# Patient Record
Sex: Male | Born: 1965 | Race: White | Hispanic: No | Smoking: Current every day smoker
Health system: Southern US, Community
[De-identification: ages and names within clinical notes are randomized; demographics above are authoritative.]

## PROBLEM LIST (undated history)

## (undated) DIAGNOSIS — F32A Depression, unspecified: Secondary | ICD-10-CM

## (undated) DIAGNOSIS — F329 Major depressive disorder, single episode, unspecified: Secondary | ICD-10-CM

## (undated) DIAGNOSIS — G4730 Sleep apnea, unspecified: Secondary | ICD-10-CM

## (undated) DIAGNOSIS — K4090 Unilateral inguinal hernia, without obstruction or gangrene, not specified as recurrent: Secondary | ICD-10-CM

## (undated) DIAGNOSIS — A159 Respiratory tuberculosis unspecified: Secondary | ICD-10-CM

## (undated) HISTORY — PX: LUMBAR DISC SURGERY: SHX700

## (undated) HISTORY — PX: BACK SURGERY: SHX140

---

## 1997-08-31 ENCOUNTER — Emergency Department (HOSPITAL_COMMUNITY): Admission: EM | Admit: 1997-08-31 | Discharge: 1997-08-31 | Payer: Self-pay | Admitting: Emergency Medicine

## 1998-06-17 ENCOUNTER — Encounter: Payer: Self-pay | Admitting: Emergency Medicine

## 1998-06-17 ENCOUNTER — Emergency Department (HOSPITAL_COMMUNITY): Admission: EM | Admit: 1998-06-17 | Discharge: 1998-06-17 | Payer: Self-pay | Admitting: Emergency Medicine

## 2002-04-01 ENCOUNTER — Emergency Department (HOSPITAL_COMMUNITY): Admission: EM | Admit: 2002-04-01 | Discharge: 2002-04-01 | Payer: Self-pay | Admitting: Emergency Medicine

## 2002-05-20 ENCOUNTER — Encounter: Payer: Self-pay | Admitting: Chiropractic Medicine

## 2002-05-20 ENCOUNTER — Ambulatory Visit (HOSPITAL_COMMUNITY): Admission: RE | Admit: 2002-05-20 | Discharge: 2002-05-20 | Payer: Self-pay | Admitting: Chiropractic Medicine

## 2002-05-24 ENCOUNTER — Emergency Department (HOSPITAL_COMMUNITY): Admission: EM | Admit: 2002-05-24 | Discharge: 2002-05-24 | Payer: Self-pay | Admitting: Emergency Medicine

## 2002-06-09 ENCOUNTER — Ambulatory Visit (HOSPITAL_COMMUNITY): Admission: RE | Admit: 2002-06-09 | Discharge: 2002-06-09 | Payer: Self-pay | Admitting: Neurosurgery

## 2002-06-09 ENCOUNTER — Encounter: Payer: Self-pay | Admitting: Neurosurgery

## 2003-02-01 ENCOUNTER — Emergency Department (HOSPITAL_COMMUNITY): Admission: EM | Admit: 2003-02-01 | Discharge: 2003-02-01 | Payer: Self-pay | Admitting: Emergency Medicine

## 2003-02-01 ENCOUNTER — Encounter: Payer: Self-pay | Admitting: Emergency Medicine

## 2003-06-07 ENCOUNTER — Emergency Department (HOSPITAL_COMMUNITY): Admission: EM | Admit: 2003-06-07 | Discharge: 2003-06-07 | Payer: Self-pay | Admitting: Emergency Medicine

## 2007-07-27 ENCOUNTER — Emergency Department (HOSPITAL_COMMUNITY): Admission: EM | Admit: 2007-07-27 | Discharge: 2007-07-27 | Payer: Self-pay | Admitting: Emergency Medicine

## 2007-07-27 HISTORY — PX: ABSCESS DRAINAGE: SHX1119

## 2010-09-17 NOTE — Consult Note (Signed)
NAMEBOYKIN, BAETZ NO.:  [id number]   MEDICAL RECORD NO.:  [id number]          PATIENT TYPE:  EMS   LOCATION:  MAJO                         FACILITY:  [facility]   PHYSICIAN:  Excell Seltzer. Annabell Howells, M.D.    DATE OF BIRTH:  09-Mar-1966   DATE OF CONSULTATION:  DATE OF DISCHARGE:                                 CONSULTATION   CHIEF COMPLAINT:  Scrotal pain.   HISTORY:  Glen Friedman is a 45 year old, white male who has a 4-day history of  scrotal pain and swelling.  He noted a boil in the scrotum which he  lanced with a needle.  He then developed several satellite lesions with  associated cellulitis.  It was felt that urologic consultation was  indicated.  The patient denies fever but has had some myalgias  particularly in his back and lower extremities.  He denies voiding  complaints or prior urologic history.   PAST HISTORY:  Pertinent for no drug allergies.   CURRENT MEDICATIONS:  None.   MEDICAL HISTORY:  Otherwise unremarkable.   SURGICAL HISTORY:  Otherwise unremarkable.   FAMILY HISTORY:  Pertinent for multiple cancers.   SOCIAL HISTORY:  He is a smoker.  He drinks occasionally.  He denies  drug abuse.  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   REVIEW OF SYSTEMS:  As noted, he denies fever.  He denies chest pain.  He denies shortness of breath.  He has no abdominal or bowel complaints.  He is voiding without difficulties.  He does have the myalgias in the  low back and extremities.  He denies any lower extremity edema.  He  denies headache.  He has no neurologic complaints.  He is otherwise  entirely without complaints.   PHYSICAL EXAMINATION:  VITAL SIGNS:  Blood pressure is 143/86,  temperature 98.8, pulse 86, respirations 20, pulse ox 97 on room air.  GENERAL:  He is well-developed, well-nourished, white male in no acute  stress.  Alert and oriented x3.  HEAD/FACE:  Normocephalic atraumatic.  NECK:  Supple.  LUNGS:  Clear.  Normal effort.  HEART:  Regular rate and rhythm.  ABDOMEN:  Soft, moderately obese, and nontender without mass,  hepatosplenomegaly, or CVA tenderness.  No hernias or inguinal  adenopathy are noted.  GU:  Unremarkable phallus.  Scrotum is slightly erythematous in the  dependent portion, and on the very posterior scrotum where it adjoins  the perineum, there is induration and tenderness with erythema.  There  is what appears to be a previously lanced boil in the midline with  induration and erythema with discrete inflammatory foci both to the left  and to the right.  Additionally, there are 2, small, satellite boils on  the more anterior scrotum on the left and right.  The testicles are  unremarkable.  There is no tenderness into the inner thighs.  RECTAL:  Not performed.  EXTREMITIES:  Full range of motion without edema.  NEUROLOGIC:  Grossly intact.  SKIN:  Warm and dry.   LAB WORK:  His white count is 13.3, hemoglobin 13.4.  There is a mild  left shift.  BMP is unremarkable with creatinine of 0.86.   Urinalysis is clear.   He had a CT pelvis which revealed mild inflammation in the inguinal and  posterior gluteal regions without evidence of abscess or abnormal gas  collection.  He had mildly enlarged reactive bilateral pelvic and  inguinal lymph nodes.   IMPRESSION:  Multiple scrotal abscesses with cellulitis.  I do not see  clinical evidence of Fournier's gangrene.  He is not a diabetic.   PLAN:  At this point, I believe he needs intravenous antibiotics in the  ER, incision and drainage of the lesions, and can be discharged home on  oral antibiotics.      Excell Seltzer. Annabell Howells, M.D.  Electronically Signed     JJW/MEDQ  D:  07/27/2007  T:  07/27/2007  Job:  [id number]   cc:   Emergency Room

## 2010-09-17 NOTE — Op Note (Signed)
Glen Friedman, Glen Friedman NO.:  [id number]   MEDICAL RECORD NO.:  [id number]          PATIENT TYPE:  EMS   LOCATION:  MAJO                         FACILITY:  [facility]   PHYSICIAN:  Excell Seltzer. Annabell Howells, M.D.    DATE OF BIRTH:  06/09/1965   DATE OF PROCEDURE:  07/27/2007  DATE OF DISCHARGE:                               OPERATIVE REPORT   PROCEDURE:  I&D of multiple scrotal abscesses.   PREOPERATIVE DIAGNOSIS:  Multiple scrotal abscesses.   POSTOPERATIVE DIAGNOSIS:  Multiple scrotal abscesses.   SURGEON:  Dr. Bjorn Pippin   ANESTHESIA:  Local.   SPECIMEN:  Wound culture.   COMPLICATIONS:  None.   INDICATIONS:  [name] is a 45 year old white male with multiple scrotal  abscess who is to undergo incision and drainage.  He has a mild  elevation of his white count but no fever, a CT that revealed some  inflammatory changes in the gluteal and inguinal region but no clinical  evidence of Fournier's-type gangrene.   FINDINGS AND PROCEDURE:  The patient was given informed consent.  His  genitalia was prepped with Betadine solution; he was draped with sterile  towels.  The various abscess sites were infiltrated with 1% lidocaine  without epinephrine.  A total of approximately 8 mL was used.  A knife  was then used to incise each of the abscesses.  The 2 most superior were  quite small but did contain some purulent material.  The 3 in the more  dependent scrotum were larger.  These were incised, probed with a  hemostat.  Only the 1 on the right produced any significant purulent  drainage.  This was cultured.  Once the abscess cavities had been probed  sufficiently, the wounds were packed with quarter-inch Iodoform gauze; a  4x4 was placed, and he was given athletic supporter to maintain  coverage.  There were no complications during the procedure.      Excell Seltzer. Annabell Howells, M.D.  Electronically Signed     JJW/MEDQ  D:  07/27/2007  T:  07/27/2007  Job:  [id number]
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
## 2011-01-27 LAB — URINALYSIS, ROUTINE W REFLEX MICROSCOPIC
Bilirubin Urine: NEGATIVE
Glucose, UA: NEGATIVE
Hgb urine dipstick: NEGATIVE
Ketones, ur: 15 — AB
Nitrite: NEGATIVE
Protein, ur: NEGATIVE
Specific Gravity, Urine: 1.031 — ABNORMAL HIGH
Urobilinogen, UA: 0.2
pH: 5.5

## 2011-01-27 LAB — DIFFERENTIAL
Basophils Absolute: 0
Basophils Relative: 0
Eosinophils Absolute: 0.3
Eosinophils Relative: 2
Lymphocytes Relative: 29
Lymphs Abs: 3.8
Monocytes Absolute: 0.9
Monocytes Relative: 7
Neutro Abs: 8.2 — ABNORMAL HIGH
Neutrophils Relative %: 62

## 2011-01-27 LAB — BASIC METABOLIC PANEL
Calcium: 9.1
Creatinine, Ser: 0.86
GFR calc Af Amer: >60
GFR calc non Af Amer: >60
Glucose, Bld: 96
Potassium: 3.6

## 2011-01-27 LAB — CULTURE, ROUTINE-ABSCESS

## 2011-01-27 LAB — URINE CULTURE
Colony Count: NO GROWTH
Culture: NO GROWTH

## 2011-01-27 LAB — CBC
HCT: 38.9 — ABNORMAL LOW
Hemoglobin: 13.4
MCHC: 34.5
MCV: 93.3
Platelets: 299
RBC: 4.17 — ABNORMAL LOW
RDW: 13.3
WBC: 13.3 — ABNORMAL HIGH

## 2011-01-27 LAB — BASIC METABOLIC PANEL WITH GFR
BUN: 11
CO2: 26
Chloride: 106
Sodium: 140

## 2013-01-26 ENCOUNTER — Encounter (HOSPITAL_COMMUNITY): Payer: Self-pay | Admitting: Anesthesiology

## 2013-01-26 ENCOUNTER — Inpatient Hospital Stay (HOSPITAL_COMMUNITY): Payer: Self-pay | Admitting: Anesthesiology

## 2013-01-26 ENCOUNTER — Emergency Department (HOSPITAL_COMMUNITY): Payer: Self-pay

## 2013-01-26 ENCOUNTER — Encounter (HOSPITAL_COMMUNITY): Admission: EM | Disposition: A | Discharge status: Home / Self Care | Payer: Self-pay | Source: Home / Self Care

## 2013-01-26 ENCOUNTER — Inpatient Hospital Stay: Admit: 2013-01-26 | Payer: Self-pay | Admitting: General Surgery

## 2013-01-26 ENCOUNTER — Inpatient Hospital Stay (HOSPITAL_COMMUNITY): Payer: Self-pay

## 2013-01-26 ENCOUNTER — Observation Stay (HOSPITAL_COMMUNITY)
Admission: EM | Admit: 2013-01-26 | Discharge: 2013-01-27 | Disposition: A | Discharge status: Home / Self Care | Payer: Self-pay | Attending: General Surgery | Admitting: General Surgery

## 2013-01-26 ENCOUNTER — Encounter (HOSPITAL_COMMUNITY): Payer: Self-pay | Admitting: Emergency Medicine

## 2013-01-26 DIAGNOSIS — Z9049 Acquired absence of other specified parts of digestive tract: Secondary | ICD-10-CM

## 2013-01-26 DIAGNOSIS — F17200 Nicotine dependence, unspecified, uncomplicated: Secondary | ICD-10-CM | POA: Insufficient documentation

## 2013-01-26 DIAGNOSIS — K3580 Unspecified acute appendicitis: Secondary | ICD-10-CM

## 2013-01-26 DIAGNOSIS — E669 Obesity, unspecified: Secondary | ICD-10-CM | POA: Insufficient documentation

## 2013-01-26 DIAGNOSIS — K4090 Unilateral inguinal hernia, without obstruction or gangrene, not specified as recurrent: Secondary | ICD-10-CM | POA: Insufficient documentation

## 2013-01-26 HISTORY — DX: Unilateral inguinal hernia, without obstruction or gangrene, not specified as recurrent: K40.90

## 2013-01-26 HISTORY — PX: LAPAROSCOPIC APPENDECTOMY: SHX408

## 2013-01-26 HISTORY — PX: APPENDECTOMY: SHX54

## 2013-01-26 HISTORY — DX: Sleep apnea, unspecified: G47.30

## 2013-01-26 HISTORY — DX: Major depressive disorder, single episode, unspecified: F32.9

## 2013-01-26 HISTORY — DX: Respiratory tuberculosis unspecified: A15.9

## 2013-01-26 HISTORY — DX: Depression, unspecified: F32.A

## 2013-01-26 LAB — CBC WITH DIFFERENTIAL/PLATELET
Basophils Absolute: 0 10*3/uL (ref 0.0–0.1)
Basophils Relative: 0 % (ref 0–1)
Eosinophils Absolute: 0.1 10*3/uL (ref 0.0–0.7)
Eosinophils Relative: 0 % (ref 0–5)
Lymphocytes Relative: 19 % (ref 12–46)
MCH: 31.7 pg (ref 26.0–34.0)
MCHC: 34.5 g/dL (ref 30.0–36.0)
MCV: 91.9 fL (ref 78.0–100.0)
Platelets: 308 10*3/uL (ref 150–400)
RDW: 13.2 % (ref 11.5–15.5)
WBC: 18.3 10*3/uL — ABNORMAL HIGH (ref 4.0–10.5)

## 2013-01-26 LAB — COMPREHENSIVE METABOLIC PANEL
ALT: 17 U/L (ref 0–53)
AST: 13 U/L (ref 0–37)
Albumin: 3.8 g/dL (ref 3.5–5.2)
Calcium: 9.1 mg/dL (ref 8.4–10.5)
Creatinine, Ser: 0.84 mg/dL (ref 0.50–1.35)
Sodium: 135 mEq/L (ref 135–145)
Total Protein: 7.9 g/dL (ref 6.0–8.3)

## 2013-01-26 LAB — URINALYSIS, ROUTINE W REFLEX MICROSCOPIC
Hgb urine dipstick: NEGATIVE
Specific Gravity, Urine: 1.026 (ref 1.005–1.030)
pH: 5.5 (ref 5.0–8.0)

## 2013-01-26 LAB — CG4 I-STAT (LACTIC ACID): Lactic Acid, Venous: 1.82 mmol/L (ref 0.5–2.2)

## 2013-01-26 SURGERY — APPENDECTOMY, LAPAROSCOPIC
Anesthesia: General | Site: Abdomen | Wound class: Contaminated

## 2013-01-26 MED ORDER — NICOTINE 14 MG/24HR TD PT24
14.0000 mg | MEDICATED_PATCH | Freq: Once | TRANSDERMAL | Status: DC
  Filled 2013-01-26: qty 1

## 2013-01-26 MED ORDER — PIPERACILLIN-TAZOBACTAM 3.375 G IVPB
3.3750 g | Freq: Once | INTRAVENOUS | Status: AC
  Administered 2013-01-26: 3.375 g via INTRAVENOUS
  Filled 2013-01-26: qty 50

## 2013-01-26 MED ORDER — MORPHINE SULFATE 2 MG/ML IJ SOLN
1.0000 mg | INTRAMUSCULAR | Status: DC | PRN
  Administered 2013-01-26 – 2013-01-27 (×2): 4 mg via INTRAVENOUS
  Filled 2013-01-26 (×2): qty 2

## 2013-01-26 MED ORDER — FENTANYL CITRATE 0.05 MG/ML IJ SOLN
INTRAMUSCULAR | Status: DC | PRN
  Administered 2013-01-26: 50 ug via INTRAVENOUS
  Administered 2013-01-26 (×2): 100 ug via INTRAVENOUS
  Administered 2013-01-26 (×4): 50 ug via INTRAVENOUS
  Administered 2013-01-26: 250 ug via INTRAVENOUS
  Administered 2013-01-26: 50 ug via INTRAVENOUS

## 2013-01-26 MED ORDER — 0.9 % SODIUM CHLORIDE (POUR BTL) OPTIME
TOPICAL | Status: DC | PRN
  Administered 2013-01-26: 1000 mL

## 2013-01-26 MED ORDER — ONDANSETRON HCL 4 MG/2ML IJ SOLN: 4.0000 mg | Freq: Once | INTRAMUSCULAR | Status: DC

## 2013-01-26 MED ORDER — GLYCOPYRROLATE 0.2 MG/ML IJ SOLN
INTRAMUSCULAR | Status: DC | PRN
  Administered 2013-01-26: .8 mg via INTRAVENOUS

## 2013-01-26 MED ORDER — PROPOFOL 10 MG/ML IV BOLUS
INTRAVENOUS | Status: DC | PRN
  Administered 2013-01-26: 300 mg via INTRAVENOUS
  Administered 2013-01-26: 100 mg via INTRAVENOUS

## 2013-01-26 MED ORDER — NEOSTIGMINE METHYLSULFATE 1 MG/ML IJ SOLN
INTRAMUSCULAR | Status: DC | PRN
  Administered 2013-01-26: 5 mg via INTRAVENOUS

## 2013-01-26 MED ORDER — HYDROMORPHONE HCL PF 1 MG/ML IJ SOLN
INTRAMUSCULAR | Status: AC
  Filled 2013-01-26: qty 2

## 2013-01-26 MED ORDER — ACETAMINOPHEN 325 MG PO TABS: 650.0000 mg | ORAL_TABLET | ORAL | Status: DC | PRN

## 2013-01-26 MED ORDER — BUPIVACAINE-EPINEPHRINE 0.25% -1:200000 IJ SOLN
INTRAMUSCULAR | Status: DC | PRN
  Administered 2013-01-26: 20 mL

## 2013-01-26 MED ORDER — ONDANSETRON HCL 4 MG/2ML IJ SOLN
4.0000 mg | Freq: Four times a day (QID) | INTRAMUSCULAR | Status: DC | PRN
  Filled 2013-01-26: qty 2

## 2013-01-26 MED ORDER — ENOXAPARIN SODIUM 40 MG/0.4ML ~~LOC~~ SOLN
40.0000 mg | SUBCUTANEOUS | Status: DC
  Filled 2013-01-26: qty 0.4

## 2013-01-26 MED ORDER — ONDANSETRON HCL 4 MG/2ML IJ SOLN: 4.0000 mg | Freq: Once | INTRAMUSCULAR | Status: DC | PRN

## 2013-01-26 MED ORDER — HYDROMORPHONE HCL PF 1 MG/ML IJ SOLN
0.2500 mg | INTRAMUSCULAR | Status: DC | PRN
  Administered 2013-01-26: 1 mg via INTRAVENOUS

## 2013-01-26 MED ORDER — SODIUM CHLORIDE 0.9 % IR SOLN
Status: DC | PRN
  Administered 2013-01-26: 1

## 2013-01-26 MED ORDER — PANTOPRAZOLE SODIUM 40 MG IV SOLR
40.0000 mg | INTRAVENOUS | Status: DC
  Administered 2013-01-26: 40 mg via INTRAVENOUS
  Filled 2013-01-26 (×2): qty 40

## 2013-01-26 MED ORDER — ENOXAPARIN SODIUM 40 MG/0.4ML ~~LOC~~ SOLN: 40.0000 mg | SUBCUTANEOUS | Status: DC

## 2013-01-26 MED ORDER — VECURONIUM BROMIDE 10 MG IV SOLR
INTRAVENOUS | Status: DC | PRN
  Administered 2013-01-26: 7 mg via INTRAVENOUS

## 2013-01-26 MED ORDER — LACTATED RINGERS IV SOLN
INTRAVENOUS | Status: DC | PRN
  Administered 2013-01-26 (×2): via INTRAVENOUS

## 2013-01-26 MED ORDER — SUCCINYLCHOLINE CHLORIDE 20 MG/ML IJ SOLN
INTRAMUSCULAR | Status: DC | PRN
  Administered 2013-01-26: 100 mg via INTRAVENOUS

## 2013-01-26 MED ORDER — OXYCODONE-ACETAMINOPHEN 5-325 MG PO TABS
1.0000 | ORAL_TABLET | ORAL | Status: DC | PRN
  Administered 2013-01-26 – 2013-01-27 (×2): 2 via ORAL
  Filled 2013-01-26 (×2): qty 2

## 2013-01-26 MED ORDER — BUPIVACAINE-EPINEPHRINE PF 0.25-1:200000 % IJ SOLN
INTRAMUSCULAR | Status: AC
  Filled 2013-01-26: qty 30

## 2013-01-26 MED ORDER — MORPHINE SULFATE 4 MG/ML IJ SOLN
4.0000 mg | Freq: Once | INTRAMUSCULAR | Status: AC
Start: 2013-01-26 — End: 2013-01-26
  Administered 2013-01-26: 4 mg via INTRAVENOUS
  Filled 2013-01-26: qty 1

## 2013-01-26 MED ORDER — ONDANSETRON HCL 4 MG/2ML IJ SOLN
INTRAMUSCULAR | Status: DC | PRN
  Administered 2013-01-26: 4 mg via INTRAVENOUS

## 2013-01-26 MED ORDER — LIDOCAINE HCL (CARDIAC) 20 MG/ML IV SOLN
INTRAVENOUS | Status: DC | PRN
  Administered 2013-01-26: 80 mg via INTRAVENOUS

## 2013-01-26 MED ORDER — IOHEXOL 300 MG/ML  SOLN
100.0000 mL | Freq: Once | INTRAMUSCULAR | Status: AC | PRN
  Administered 2013-01-26: 100 mL via INTRAVENOUS

## 2013-01-26 MED ORDER — KCL IN DEXTROSE-NACL 20-5-0.9 MEQ/L-%-% IV SOLN
INTRAVENOUS | Status: DC
  Administered 2013-01-26 – 2013-01-27 (×2): via INTRAVENOUS
  Filled 2013-01-26 (×5): qty 1000

## 2013-01-26 MED ORDER — MORPHINE SULFATE 2 MG/ML IJ SOLN: 2.0000 mg | INTRAMUSCULAR | Status: DC | PRN

## 2013-01-26 MED ORDER — MIDAZOLAM HCL 5 MG/5ML IJ SOLN
INTRAMUSCULAR | Status: DC | PRN
  Administered 2013-01-26: 2 mg via INTRAVENOUS

## 2013-01-26 SURGICAL SUPPLY — 51 items
APPLIER CLIP ROT 10 11.4 M/L (STAPLE)
BENZOIN TINCTURE PRP APPL 2/3 (GAUZE/BANDAGES/DRESSINGS) ×2 IMPLANT
BLADE SURG ROTATE 9660 (MISCELLANEOUS) IMPLANT
CANISTER SUCTION 2500CC (MISCELLANEOUS) ×2 IMPLANT
CHLORAPREP W/TINT 26ML (MISCELLANEOUS) ×2 IMPLANT
CLIP APPLIE ROT 10 11.4 M/L (STAPLE) IMPLANT
CLOTH BEACON ORANGE TIMEOUT ST (SAFETY) ×2 IMPLANT
CLSR STERI-STRIP ANTIMIC 1/2X4 (GAUZE/BANDAGES/DRESSINGS) ×2 IMPLANT
COVER SURGICAL LIGHT HANDLE (MISCELLANEOUS) ×2 IMPLANT
CUTTER FLEX LINEAR 45M (STAPLE) ×2 IMPLANT
DECANTER SPIKE VIAL GLASS SM (MISCELLANEOUS) ×2 IMPLANT
DERMABOND ADVANCED (GAUZE/BANDAGES/DRESSINGS)
DERMABOND ADVANCED .7 DNX12 (GAUZE/BANDAGES/DRESSINGS) IMPLANT
DRAPE UTILITY 15X26 W/TAPE STR (DRAPE) ×4 IMPLANT
ELECT REM PT RETURN 9FT ADLT (ELECTROSURGICAL) ×2
ELECTRODE REM PT RTRN 9FT ADLT (ELECTROSURGICAL) ×1 IMPLANT
ENDOLOOP SUT PDS II  0 18 (SUTURE)
ENDOLOOP SUT PDS II 0 18 (SUTURE) IMPLANT
GAUZE SPONGE 2X2 8PLY STRL LF (GAUZE/BANDAGES/DRESSINGS) ×1 IMPLANT
GLOVE BIOGEL M STRL SZ7.5 (GLOVE) ×2 IMPLANT
GLOVE BIOGEL PI IND STRL 6.5 (GLOVE) ×1 IMPLANT
GLOVE BIOGEL PI IND STRL 7.0 (GLOVE) ×1 IMPLANT
GLOVE BIOGEL PI IND STRL 8 (GLOVE) ×1 IMPLANT
GLOVE BIOGEL PI INDICATOR 6.5 (GLOVE) ×1
GLOVE BIOGEL PI INDICATOR 7.0 (GLOVE) ×1
GLOVE BIOGEL PI INDICATOR 8 (GLOVE) ×1
GLOVE SS N UNI LF 6.5 STRL (GLOVE) ×2 IMPLANT
GLOVE SURG SS PI 7.0 STRL IVOR (GLOVE) ×2 IMPLANT
GOWN STRL NON-REIN LRG LVL3 (GOWN DISPOSABLE) ×6 IMPLANT
GOWN STRL REIN XL XLG (GOWN DISPOSABLE) ×2 IMPLANT
KIT BASIN OR (CUSTOM PROCEDURE TRAY) ×2 IMPLANT
KIT ROOM TURNOVER OR (KITS) ×2 IMPLANT
NS IRRIG 1000ML POUR BTL (IV SOLUTION) ×2 IMPLANT
PAD ARMBOARD 7.5X6 YLW CONV (MISCELLANEOUS) ×4 IMPLANT
POUCH SPECIMEN RETRIEVAL 10MM (ENDOMECHANICALS) ×2 IMPLANT
RELOAD 45 VASCULAR/THIN (ENDOMECHANICALS) ×2 IMPLANT
RELOAD STAPLE TA45 3.5 REG BLU (ENDOMECHANICALS) ×2 IMPLANT
SCALPEL HARMONIC ACE (MISCELLANEOUS) ×2 IMPLANT
SCISSORS LAP 5X35 DISP (ENDOMECHANICALS) ×2 IMPLANT
SET IRRIG TUBING LAPAROSCOPIC (IRRIGATION / IRRIGATOR) ×2 IMPLANT
SPECIMEN JAR SMALL (MISCELLANEOUS) ×2 IMPLANT
SPONGE GAUZE 2X2 STER 10/PKG (GAUZE/BANDAGES/DRESSINGS) ×1
SUT MNCRL AB 4-0 PS2 18 (SUTURE) ×2 IMPLANT
SUT VICRYL 0 UR6 27IN ABS (SUTURE) ×2 IMPLANT
TAPE CLOTH SOFT 2X10 (GAUZE/BANDAGES/DRESSINGS) ×2 IMPLANT
TOWEL OR 17X24 6PK STRL BLUE (TOWEL DISPOSABLE) ×2 IMPLANT
TOWEL OR 17X26 10 PK STRL BLUE (TOWEL DISPOSABLE) ×2 IMPLANT
TRAY FOLEY CATH 16FR SILVER (SET/KITS/TRAYS/PACK) ×2 IMPLANT
TRAY LAPAROSCOPIC (CUSTOM PROCEDURE TRAY) ×2 IMPLANT
TROCAR XCEL BLADELESS 5X75MML (TROCAR) ×4 IMPLANT
TROCAR XCEL BLUNT TIP 100MML (ENDOMECHANICALS) ×2 IMPLANT

## 2013-01-26 NOTE — ED Provider Notes (Signed)
CSN: [id number]     Arrival date & time 01/26/13  7829 History   First MD Initiated Contact with Patient 01/26/13 [phone]     Chief Complaint  Patient presents with  . Abdominal Pain   (Consider location/radiation/quality/duration/timing/severity/associated sxs/prior Treatment) The history is provided by the patient and medical records.   Pt presents to the ED for RLQ/right groin pain x 2 weeks.  Pt states over the past few days upon standing he has felt a "pop" in his right lower abdomen and often see's a "bulge" there.  Pt states sx appeared after helping a friend move to a new house-- he did do a lot of heavy lifting that day.  Over the past 2 days, pain has improved, now rated at 4/10, but pt has felt chilled with some nausea and poor PO intake.  Last BM was 2 days ago.  Pt also admits to some night sweats last night and feels very flushed on arrival to the ED.  No reported fever.  No prior abdominal surgeries.  No significant PMH, not on any daily medications.  VS stable on arrival.  History reviewed. No pertinent past medical history. No past surgical history on file. No family history on file. History  Substance Use Topics  . Smoking status: Current Every Day Smoker  . Smokeless tobacco: Not on file  . Alcohol Use: Yes    Review of Systems  Constitutional: Positive for chills.  Gastrointestinal: Positive for nausea and abdominal pain.  All other systems reviewed and are negative.    Allergies  Advil  Home Medications  No current outpatient prescriptions on file. BP 106/75  Pulse 99  Temp(Src) 98.8 F (37.1 C)  Resp 16  SpO2 99%  Physical Exam  Nursing note and vitals reviewed. Constitutional: He is oriented to person, place, and time. He appears well-developed and well-nourished. No distress.  Diaphoretic but NAD  HENT:  Head: Normocephalic and atraumatic.  Mouth/Throat: Oropharynx is clear and moist.  Eyes: Conjunctivae and EOM are normal. Pupils are equal, round,  and reactive to light.  Neck: Normal range of motion.  Cardiovascular: Normal rate, regular rhythm and normal heart sounds.   Pulmonary/Chest: Effort normal and breath sounds normal. No respiratory distress. He has no wheezes.  Abdominal: Soft. Bowel sounds are normal. There is tenderness in the right lower quadrant and suprapubic area. There is tenderness at McBurney's point. There is no rigidity, no guarding, no CVA tenderness and negative Murphy's sign. A hernia is present. Hernia confirmed positive in the right inguinal area.    Slight right inguinal bulge, easily reducible; TTP at McBurney's point  Genitourinary: Penis normal. Right testis shows no tenderness. Left testis shows no tenderness. Circumcised.  Musculoskeletal: Normal range of motion.  Neurological: He is alert and oriented to person, place, and time.  Skin: Skin is warm. He is diaphoretic.  Psychiatric: He has a normal mood and affect.    ED Course  Procedures (including critical care time)   Date: 01/26/2013  Rate: 83  Rhythm: normal sinus rhythm  QRS Axis: normal  Intervals: normal  ST/T Wave abnormalities: normal  Conduction Disutrbances:none  Narrative Interpretation:   Old EKG Reviewed: none available   Labs Review Labs Reviewed  CBC WITH DIFFERENTIAL - Abnormal; Notable for the following:    WBC 18.3 (*)    Neutro Abs 13.3 (*)    Monocytes Absolute 1.4 (*)    All other components within normal limits  COMPREHENSIVE METABOLIC PANEL - Abnormal; Notable for  the following:    Potassium 3.4 (*)    Glucose, Bld 122 (*)    All other components within normal limits  URINALYSIS, ROUTINE W REFLEX MICROSCOPIC - Abnormal; Notable for the following:    Color, Urine AMBER (*)    APPearance CLOUDY (*)    Bilirubin Urine SMALL (*)    Ketones, ur 15 (*)    All other components within normal limits  CG4 I-STAT (LACTIC ACID)   Imaging Review Ct Abdomen Pelvis W Contrast  01/26/2013   CLINICAL DATA:  Right lower  quadrant pain for 2 weeks  EXAM: CT ABDOMEN AND PELVIS WITH CONTRAST  TECHNIQUE: Multidetector CT imaging of the abdomen and pelvis was performed using the standard protocol following bolus administration of intravenous contrast.  CONTRAST:  OMNIPAQUE IOHEXOL 300 MG/ML  SOLN  COMPARISON:  CT pelvis of 07/27/2007  FINDINGS: The lung bases are clear. The liver enhances with no focal abnormality and no ductal dilatation is seen. No calcified gallstones are noted. The pancreas is normal in size in the pancreatic duct is not dilated. The adrenal glands and spleen are unremarkable. The stomach is decompressed. The kidneys enhance with no focal abnormality and on delayed images, the pelvocaliceal systems are unremarkable. The abdominal aorta is normal in caliber. No adenopathy is seen.  However, within the right lower quadrant extending medially the appendix is markedly dilated and there is an inflammatory reaction surrounding this distended appendix consistent with acute appendicitis. The appendix measures approximately 13 mm in diameter and there is edema at the appendiceal orifice at the base of the cecum. No abscess is seen but perforation cannot be excluded with considerable surrounding inflammatory response. No free peritoneal air is noted. The urinary bladder is unremarkable. The prostate is normal in size. A right inguinal hernia is present containing a nondilated loop of small bowel. There are a few rectosigmoid colonic diverticular noted. The terminal ileum is unremarkable. There is degenerative disc disease at both L4-5 and L5-S1.  IMPRESSION: 1. Findings consistent with acute appendicitis. Possible contained perforation. No abscess. 2. Right inguinal hernia containing a nondilated loop of small bowel. 3. Degenerative disc disease at L4-5 and L5-S1.  CriticalValue/emergent results were called by telephone at the time of interpretation on 01/26/2013 at 12:44 PMto Dr. Rochele Raring, who verbally acknowledged  these results.   Electronically Signed   By: Dwyane Dee M.D.   On: 01/26/2013 12:45    MDM   1. Acute appendicitis    Leukocytosis at 18.3.  U/a without signs of infection.  CT abd pelvis revealing acute appendicitis with possible perforation but no definitive abscess.  Zosyn started in the ED.  Pain and nausea well controlled with morphine and zofran.  Consulted general surgery, spoke with surgical PA-C Tresa Endo.  Pt will be evaluated in the ED, surgery to admit.  VS stable.  Garlon Hatchet, PA-C 01/26/13 1400

## 2013-01-26 NOTE — Anesthesia Preprocedure Evaluation (Signed)
Anesthesia Evaluation  Patient identified by MRN, date of birth, ID band Patient awake    Reviewed: Allergy & Precautions, H&P , NPO status , Patient's Chart, lab work & pertinent test results  Airway Mallampati: III      Dental  (+) Teeth Intact   Pulmonary          Cardiovascular     Neuro/Psych    GI/Hepatic   Endo/Other    Renal/GU      Musculoskeletal   Abdominal   Peds  Hematology   Anesthesia Other Findings   Reproductive/Obstetrics                           Anesthesia Physical Anesthesia Plan  ASA: II and emergent  Anesthesia Plan: General   Post-op Pain Management:    Induction: Intravenous  Airway Management Planned: Oral ETT  Additional Equipment:   Intra-op Plan:   Post-operative Plan: Extubation in OR  Informed Consent: I have reviewed the patients History and Physical, chart, labs and discussed the procedure including the risks, benefits and alternatives for the proposed anesthesia with the patient or authorized representative who has indicated his/her understanding and acceptance.   Dental advisory given  Plan Discussed with: CRNA, Anesthesiologist and Surgeon  Anesthesia Plan Comments:         Anesthesia Quick Evaluation

## 2013-01-26 NOTE — ED Notes (Signed)
Lactic acid results shown to PA-C, L. Allyne Gee

## 2013-01-26 NOTE — H&P (Signed)
I saw the patient, participated in the history, exam and medical decision making, and concur with the physician assistant's note above.  i obtained the history and the NP was the scribe  RLQ TTP.   Acute appendicitis RIH - defer mgmt for now  We discussed the etiology and management of acute appendicitis. We discussed operative and nonoperative management.  I recommended operative management along with IV antibiotics.  We discussed laparoscopic appendectomy. We discussed the risk and benefits of surgery including but not limited to bleeding, infection, injury to surrounding structures, need to convert to an open procedure, blood clot formation, post operative abscess or wound infection, staple line complications such as leak or bleeding, hernia formation, post operative ileus, need for additional procedures, anesthesia complications, and the typical postoperative course. I explained that the patient should expect a good improvement in their symptoms.   Mary Sella. Andrey Campanile, MD, FACS General, Bariatric, & Minimally Invasive Surgery [facility], [place]

## 2013-01-26 NOTE — ED Provider Notes (Signed)
Medical screening examination/treatment/procedure(s) were conducted as a shared visit with non-physician practitioner(s) and myself.  I personally evaluated the patient during the encounter.  Pt is a 47 y.o. male with no significant past medical history who presents the emergency department with 2-3 days of right lower quadrant pain, subjective fevers and chills, nausea, anorexia. He states he is also had in the right inguinal hernia that is been present for 2-3 weeks but he has no difficulty reducing this hernia. On exam, patient vital signs are stable. He does appear uncomfortable. He is tender to palpation at McBurney's point with mild voluntary guarding. He has a right inguinal hernia which is easily reducible and nontender. No scrotal skin mass is, testicular pain or swelling, penile discharge. Concern for possible appendicitis. Patient denies a history of prior abdominal surgeries. Labs show leukocytosis with left shift. We'll obtain CT abdomen and pelvis.  Patient CT scan shows appendicitis. Will give Zosyn and consult surgery. Patient updated. Will keep n.p.o.  Layla Maw Eliannah Hinde, DO 01/26/13 1554

## 2013-01-26 NOTE — Op Note (Signed)
Appendectomy, Lap, Procedure Note  Indications: The patient presented with a history of right-sided abdominal pain. A CT revealed findings consistent with acute appendicitis.  Pre-operative Diagnosis: Acute appendicitis without mention of peritonitis  Post-operative Diagnosis: Same; Indirect right indirect inguinal hernia  Surgeon: Atilano Ina   Assistants: none  Anesthesia: General endotracheal anesthesia  ASA Class: 2, E  Procedure Details  The patient was seen again in the Holding Room. The risks, benefits, complications, treatment options, and expected outcomes were discussed with the patient and/or family. The possibilities of perforation of viscus, bleeding, recurrent infection, the need for additional procedures, failure to diagnose a condition, and creating a complication requiring transfusion or operation were discussed. There was concurrence with the proposed plan and informed consent was obtained. The site of surgery was properly noted. The patient was taken to Operating Room, identified as [facility] and the procedure verified as Appendectomy. A Time Out was held and the above information confirmed.  The patient was placed in the supine position and general anesthesia was induced, along with placement of orogastric tube, SCDs, and a Foley catheter. The abdomen was prepped and draped in a sterile fashion. A 1.5 centimeter infraumbilical incision was made.  The umbilical stalk was elevated, and the midline fascia was incised with a #11 blade.  A Kelly clamp was used to confirm entrance into the peritoneal cavity.  A pursestring suture was passed around the incision with a 0 Vicryl.  A 12mm Hasson was introduced into the abdomen and the tails of the suture were used to hold the Hasson in place.   The pneumoperitoneum was then established to steady pressure of 15 mmHg.  Additional 5 mm cannulas then placed in the left lower quadrant of the abdomen and the suprapubic region under  direct visualization. A careful evaluation of the entire abdomen was carried out. The patient was placed in Trendelenburg and left lateral decubitus position. The small intestines were retracted in the cephalad and left lateral direction away from the pelvis and right lower quadrant. The patient was found to have an very inflammed thickened appendix that was extending into the pelvis. There was no evidence of perforation. The appendix was adhered to a segment of small bowel mesentery as it draped into the pelvis over the sacral promontory  The appendix was carefully dissected. I was able to pull it off from the small bowel mesentery.  The appendix was was skeletonized with the harmonic scalpel.   The appendix was divided at its base using an endo-GIA stapler with a white load. However during firing of the stapler it would not completely fire due to tissue thickness. I removed the stapler and loaded a blue load cartridge. I then placed the stapler proximal to the previous attempted staple fire (further onto the cecum). The stapler fired easily. No appendiceal stump was left in place. The appendix was removed from the abdomen with an Endocatch bag through the umbilical port.  i carefully reinspected the staple line and it appeared completely intact. There were 3 rows of staples. There was no evidence of bleeding, leakage, or complication after division of the appendix. Irrigation was also performed and irrigate suctioned from the abdomen as well.  The umbilical port site was closed with the purse string suture. The closure was viewed laparoscopically. I elected to place an additional 0-vicryl at the umbilical fascia. There was no residual palpable fascial defect.  The trocar site skin wounds were closed with 4-0 Monocryl. Benzoin, steri-strips and sterile bandaids were  applied to the skin incisions.  Instrument, sponge, and needle counts were correct at the conclusion of the case.   Findings: The appendix was  found to be inflamed. There were early signs of necrosis.  There was not perforation. There was not abscess formation.  Estimated Blood Loss:  Minimal         Drains: none         Specimens: appendix         Complications:  None; patient tolerated the procedure well.         Disposition: PACU - hemodynamically stable.         Condition: stable  Mary Sella. Andrey Campanile, MD, FACS General, Bariatric, & Minimally Invasive Surgery [facility], [place]

## 2013-01-26 NOTE — H&P (Signed)
Chief Complaint: abdominal pain  HPI: Glen Friedman is a 47 year old WM with a history of tobacco and marijuana use who presented to [facility] this morning with abdominal pain.  Duration of symptoms is 2 weeks.  Onset was sudden, preceded by helping friends move at which time he "felt something poke out."  This would intermittently "poke" in and out.  It was not until 2 days ago that his symptoms changed and worsened.  The pain was band like across lower abdomen with radiates to the back. Characterized as sharp pain and felt like, "someone kicked me in the stomach. Modifying factors include; drinking cranberry juice, pepsi.  No aggravating or alleviating factors.  Associated with nausea which began yesterday, subjective fevers, chills, malaise, loss of appetite.  He denies vomiting. Last bowel movement 2 days ago.  He reports an episode of hematochezia about 5 months ago, none since.  He denies unintentional weight loss.  Reports dysuria, reduced stream.  He admits his oral intake is reduced.   He denies chest pains, complains of subscapular back pain that is positional.  This pain does not worsen with activity and he denies dyspnea on exertion.   He is a 2ppd smoker.  He rarely drinks alcohol.  He admits to marijuana use, last use was yesterday.     Past Medical History  Diagnosis Date  . Depression     Past Surgical History  Procedure Laterality Date  . Back surgery      Family History  Problem Relation Age of Onset  . Breast cancer Mother   . Colon cancer Father    Social History:  reports that he has been smoking.  He does not have any smokeless tobacco history on file. He reports that  drinks alcohol. He reports that he uses illicit drugs (Marijuana).  Allergies:  Allergies  Allergen Reactions  . Advil [Ibuprofen] Shortness Of Breath and Swelling  . Aleve [Naproxen Sodium] Shortness Of Breath and Swelling     (Not in a hospital admission)  Results for orders placed during the  hospital encounter of 01/26/13 (from the past 48 hour(s))  CG4 I-STAT (LACTIC ACID)     Status: None   Collection Time    01/26/13  8:26 AM      Result Value Range   Lactic Acid, Venous 1.82  0.5 - 2.2 mmol/L  CBC WITH DIFFERENTIAL     Status: Abnormal   Collection Time    01/26/13  8:30 AM      Result Value Range   WBC 18.3 (*) 4.0 - 10.5 K/uL   RBC 4.92  4.22 - 5.81 MIL/uL   Hemoglobin 15.6  13.0 - 17.0 g/dL   HCT 40.9  81.1 - 91.4 %   MCV 91.9  78.0 - 100.0 fL   MCH 31.7  26.0 - 34.0 pg   MCHC 34.5  30.0 - 36.0 g/dL   RDW 78.2  95.6 - 21.3 %   Platelets 308  150 - 400 K/uL   Neutrophils Relative % 73  43 - 77 %   Neutro Abs 13.3 (*) 1.7 - 7.7 K/uL   Lymphocytes Relative 19  12 - 46 %   Lymphs Abs 3.5  0.7 - 4.0 K/uL   Monocytes Relative 8  3 - 12 %   Monocytes Absolute 1.4 (*) 0.1 - 1.0 K/uL   Eosinophils Relative 0  0 - 5 %   Eosinophils Absolute 0.1  0.0 - 0.7 K/uL   Basophils Relative  0  0 - 1 %   Basophils Absolute 0.0  0.0 - 0.1 K/uL  COMPREHENSIVE METABOLIC PANEL     Status: Abnormal   Collection Time    01/26/13  8:30 AM      Result Value Range   Sodium 135  135 - 145 mEq/L   Potassium 3.4 (*) 3.5 - 5.1 mEq/L   Chloride 98  96 - 112 mEq/L   CO2 23  19 - 32 mEq/L   Glucose, Bld 122 (*) 70 - 99 mg/dL   BUN 9  6 - 23 mg/dL   Creatinine, Ser 1.61  0.50 - 1.35 mg/dL   Calcium 9.1  8.4 - 09.6 mg/dL   Total Protein 7.9  6.0 - 8.3 g/dL   Albumin 3.8  3.5 - 5.2 g/dL   AST 13  0 - 37 U/L   ALT 17  0 - 53 U/L   Alkaline Phosphatase 102  39 - 117 U/L   Total Bilirubin 0.7  0.3 - 1.2 mg/dL   GFR calc non Af Amer >90  >90 mL/min   GFR calc Af Amer >90  >90 mL/min   Comment: (NOTE)     The eGFR has been calculated using the CKD EPI equation.     This calculation has not been validated in all clinical situations.     eGFR's persistently <90 mL/min signify possible Chronic Kidney     Disease.  URINALYSIS, ROUTINE W REFLEX MICROSCOPIC     Status: Abnormal   Collection  Time    01/26/13  9:31 AM      Result Value Range   Color, Urine AMBER (*) YELLOW   Comment: BIOCHEMICALS MAY BE AFFECTED BY COLOR   APPearance CLOUDY (*) CLEAR   Specific Gravity, Urine 1.026  1.005 - 1.030   pH 5.5  5.0 - 8.0   Glucose, UA NEGATIVE  NEGATIVE mg/dL   Hgb urine dipstick NEGATIVE  NEGATIVE   Bilirubin Urine SMALL (*) NEGATIVE   Ketones, ur 15 (*) NEGATIVE mg/dL   Protein, ur NEGATIVE  NEGATIVE mg/dL   Urobilinogen, UA 1.0  0.0 - 1.0 mg/dL   Nitrite NEGATIVE  NEGATIVE   Leukocytes, UA NEGATIVE  NEGATIVE   Comment: MICROSCOPIC NOT DONE ON URINES WITH NEGATIVE PROTEIN, BLOOD, LEUKOCYTES, NITRITE, OR GLUCOSE <1000 mg/dL.   Ct Abdomen Pelvis W Contrast  01/26/2013   CLINICAL DATA:  Right lower quadrant pain for 2 weeks  EXAM: CT ABDOMEN AND PELVIS WITH CONTRAST  TECHNIQUE: Multidetector CT imaging of the abdomen and pelvis was performed using the standard protocol following bolus administration of intravenous contrast.  CONTRAST:  OMNIPAQUE IOHEXOL 300 MG/ML  SOLN  COMPARISON:  CT pelvis of 07/27/2007  FINDINGS: The lung bases are clear. The liver enhances with no focal abnormality and no ductal dilatation is seen. No calcified gallstones are noted. The pancreas is normal in size in the pancreatic duct is not dilated. The adrenal glands and spleen are unremarkable. The stomach is decompressed. The kidneys enhance with no focal abnormality and on delayed images, the pelvocaliceal systems are unremarkable. The abdominal aorta is normal in caliber. No adenopathy is seen.  However, within the right lower quadrant extending medially the appendix is markedly dilated and there is an inflammatory reaction surrounding this distended appendix consistent with acute appendicitis. The appendix measures approximately 13 mm in diameter and there is edema at the appendiceal orifice at the base of the cecum. No abscess is seen but perforation cannot be  excluded with considerable surrounding  inflammatory response. No free peritoneal air is noted. The urinary bladder is unremarkable. The prostate is normal in size. A right inguinal hernia is present containing a nondilated loop of small bowel. There are a few rectosigmoid colonic diverticular noted. The terminal ileum is unremarkable. There is degenerative disc disease at both L4-5 and L5-S1.  IMPRESSION: 1. Findings consistent with acute appendicitis. Possible contained perforation. No abscess. 2. Right inguinal hernia containing a nondilated loop of small bowel. 3. Degenerative disc disease at L4-5 and L5-S1.  CriticalValue/emergent results were called by telephone at the time of interpretation on 01/26/2013 at 12:44 PMto Dr. Rochele Raring, who verbally acknowledged these results.   Electronically Signed   By: Dwyane Dee M.D.   On: 01/26/2013 12:45    Review of Systems  Constitutional: Positive for fever, malaise/fatigue and diaphoresis. Negative for chills and weight loss.  Eyes: Positive for double vision.  Respiratory: Negative for wheezing.        C/o SOB at night, questionable underlying OSA  Cardiovascular: Positive for PND. Negative for chest pain, palpitations and leg swelling.  Gastrointestinal: Positive for nausea and abdominal pain. Negative for vomiting, diarrhea, constipation, blood in stool and melena.  Genitourinary: Positive for dysuria. Negative for urgency and hematuria.  Neurological: Negative for dizziness, speech change, focal weakness, seizures, loss of consciousness, weakness and headaches.  Psychiatric/Behavioral: Positive for depression.    Blood pressure 106/75, pulse 99, temperature 98.8 F (37.1 C), resp. rate 16, SpO2 99.00%. Physical Exam  Constitutional: He is oriented to person, place, and time. He appears well-developed and well-nourished. No distress.  HENT:  Head: Normocephalic and atraumatic.  Neck: Normal range of motion. Neck supple.  Cardiovascular: Normal rate, regular rhythm, normal heart  sounds and intact distal pulses.  Exam reveals no gallop and no friction rub.   No murmur heard. Respiratory: Effort normal and breath sounds normal. No respiratory distress. He has no wheezes. He has no rales. He exhibits no tenderness.  GI: Soft. Bowel sounds are normal. He exhibits no distension and no mass. There is no guarding.  TTP lower abdomen.  No evidence of peritonitis  Musculoskeletal: He exhibits no edema and no tenderness.  Lymphadenopathy:    He has no cervical adenopathy.  Neurological: He is alert and oriented to person, place, and time.  Skin: Skin is warm and dry. No rash noted. He is not diaphoretic. No erythema. No pallor.  Psychiatric: He has a normal mood and affect. His behavior is normal. Thought content normal.     Assessment/Plan Acute Appendicitis Proceed with laparoscopic appendectomy. Obtain consent. With his tobacco history obtain a chest x ray and EKG.  He has already received Zosyn.  NPO, IVF, anti-emetics and pain control.    Tobacco use nicotine patch  Glen Friedman ANP-BC Pager [phone]  01/26/2013, 1:23 PM

## 2013-01-26 NOTE — Transfer of Care (Signed)
Immediate Anesthesia Transfer of Care Note  Patient: Glen Friedman  Procedure(s) Performed: Procedure(s): APPENDECTOMY LAPAROSCOPIC (N/A)  Patient Location: PACU  Anesthesia Type:General  Level of Consciousness: awake, alert  and oriented  Airway & Oxygen Therapy: Patient Spontanous Breathing and Patient connected to nasal cannula oxygen  Post-op Assessment: Report given to PACU RN, Post -op Vital signs reviewed and stable and Patient moving all extremities  Post vital signs: Reviewed and stable  Complications: No apparent anesthesia complications

## 2013-01-26 NOTE — ED Notes (Signed)
Done and 12 lead done pt to short stay

## 2013-01-26 NOTE — ED Notes (Addendum)
Rt lower abd pain x 2 weeks and pt states feels a mushy popping feeling when he gets up ? Hernia he thinks not eating and having nausea and sweats hurts less today he states than before

## 2013-01-26 NOTE — Preoperative (Signed)
Beta Blockers   Reason not to administer Beta Blockers:Not Applicable 

## 2013-01-27 ENCOUNTER — Encounter (HOSPITAL_COMMUNITY): Payer: Self-pay | Admitting: General Surgery

## 2013-01-27 LAB — BASIC METABOLIC PANEL
BUN: 6 mg/dL (ref 6–23)
CO2: 26 mEq/L (ref 19–32)
Chloride: 104 mEq/L (ref 96–112)
GFR calc Af Amer: >90 mL/min (ref >90)
Potassium: 3.8 mEq/L (ref 3.5–5.1)
Sodium: 139 mEq/L (ref 135–145)

## 2013-01-27 LAB — CBC
HCT: 36.8 % — ABNORMAL LOW (ref 39.0–52.0)
MCV: 91.3 fL (ref 78.0–100.0)
Platelets: 237 10*3/uL (ref 150–400)
RBC: 4.03 MIL/uL — ABNORMAL LOW (ref 4.22–5.81)
RDW: 13.1 % (ref 11.5–15.5)
WBC: 12.5 10*3/uL — ABNORMAL HIGH (ref 4.0–10.5)

## 2013-01-27 LAB — MRSA PCR SCREENING: MRSA by PCR: NEGATIVE

## 2013-01-27 MED ORDER — OXYCODONE-ACETAMINOPHEN 5-325 MG PO TABS: 1.0000 | ORAL_TABLET | ORAL | Status: DC | PRN

## 2013-01-27 NOTE — Progress Notes (Signed)
Pt discharged to home

## 2013-01-27 NOTE — Anesthesia Postprocedure Evaluation (Signed)
  Anesthesia Post-op Note  Patient: Glen Friedman  Procedure(s) Performed: Procedure(s): APPENDECTOMY LAPAROSCOPIC (N/A)  Patient Location: PACU  Anesthesia Type:General  Level of Consciousness: awake, alert , oriented and patient cooperative  Airway and Oxygen Therapy: Patient Spontanous Breathing  Post-op Pain: mild  Post-op Assessment: Post-op Vital signs reviewed, Patient's Cardiovascular Status Stable, Respiratory Function Stable, Patent Airway, No signs of Nausea or vomiting and Pain level controlled  Post-op Vital Signs: stable  Complications: No apparent anesthesia complications

## 2013-01-27 NOTE — Progress Notes (Signed)
Some nausea; last night. None now. Tolerated liquids. No flatus  Alert, nad Obese, soft, approp TTP (doesn't appear distended to me)  Await flatus then can go home  Glen Friedman. Andrey Campanile, MD, FACS General, Bariatric, & Minimally Invasive Surgery [facility], [place]

## 2013-01-27 NOTE — Discharge Summary (Signed)
Physician Discharge Summary  Patient ID: Glen Friedman MRN: [id number] DOB/AGE: 1965/06/28 47 y.o.  Admit date: 01/26/2013 Discharge date: 01/27/2013  Admitting Diagnosis: Acute appendicitis   Discharge Diagnosis Patient Active Problem List   Diagnosis Date Noted  . Acute appendicitis without mention of peritonitis 01/26/2013  . S/P laparoscopic appendectomy 01/26/2013    Consultants none  Imaging: Ct Abdomen Pelvis W Contrast  01/26/2013   CLINICAL DATA:  Right lower quadrant pain for 2 weeks  EXAM: CT ABDOMEN AND PELVIS WITH CONTRAST  TECHNIQUE: Multidetector CT imaging of the abdomen and pelvis was performed using the standard protocol following bolus administration of intravenous contrast.  CONTRAST:  OMNIPAQUE IOHEXOL 300 MG/ML  SOLN  COMPARISON:  CT pelvis of 07/27/2007  FINDINGS: The lung bases are clear. The liver enhances with no focal abnormality and no ductal dilatation is seen. No calcified gallstones are noted. The pancreas is normal in size in the pancreatic duct is not dilated. The adrenal glands and spleen are unremarkable. The stomach is decompressed. The kidneys enhance with no focal abnormality and on delayed images, the pelvocaliceal systems are unremarkable. The abdominal aorta is normal in caliber. No adenopathy is seen.  However, within the right lower quadrant extending medially the appendix is markedly dilated and there is an inflammatory reaction surrounding this distended appendix consistent with acute appendicitis. The appendix measures approximately 13 mm in diameter and there is edema at the appendiceal orifice at the base of the cecum. No abscess is seen but perforation cannot be excluded with considerable surrounding inflammatory response. No free peritoneal air is noted. The urinary bladder is unremarkable. The prostate is normal in size. A right inguinal hernia is present containing a nondilated loop of small bowel. There are a few rectosigmoid colonic  diverticular noted. The terminal ileum is unremarkable. There is degenerative disc disease at both L4-5 and L5-S1.  IMPRESSION: 1. Findings consistent with acute appendicitis. Possible contained perforation. No abscess. 2. Right inguinal hernia containing a nondilated loop of small bowel. 3. Degenerative disc disease at L4-5 and L5-S1.  CriticalValue/emergent results were called by telephone at the time of interpretation on 01/26/2013 at 12:44 PMto Dr. Rochele Raring, who verbally acknowledged these results.   Electronically Signed   By: Dwyane Dee M.D.   On: 01/26/2013 12:45   Dg Chest Port 1 View  01/26/2013   CLINICAL DATA:  Shortness of Breath  EXAM: PORTABLE CHEST - 1 VIEW  COMPARISON:  June 07, 2003  FINDINGS: There is trace interstitial edema in the lower lobes. Lungs are otherwise clear. Heart is upper normal in size with normal pulmonary vascularity. No adenopathy. No bone lesions.  IMPRESSION: Trace interstitial edema in the lower lobes. Lungs otherwise clear.   Electronically Signed   By: Bretta Bang   On: 01/26/2013 14:05    Procedures Laparoscopic Appendectomy(Dr. Andrey Campanile 01/26/13)  Hospital Course:  Glen Friedman is a healthy male who presented to [facility] with rlq abdominal pain.  Workup showed acute appendicitis.  Patient was admitted and underwent procedure listed above.  Tolerated procedure well and was transferred to the floor.  Diet was advanced as tolerated.  On POD #1, the patient was voiding well, tolerating diet, ambulating well, pain well controlled, vital signs stable, incisions c/d/i and felt stable for discharge home.  Patient will follow up in our office in 2 weeks and knows to call with questions or concerns.  We discussed his tobacco use and effects on healing. We discussed warning signs that warrant immediate  attention.    Physical Exam: General:  Alert, NAD, pleasant, comfortable Abd:  Soft, ND, mild tenderness, incisions    Medication List          oxyCODONE-acetaminophen 5-325 MG per tablet  Commonly known as:  PERCOCET/ROXICET  Take 1-2 tablets by mouth every 4 (four) hours as needed.             Follow-up Information   Follow up with [facility] On 02/22/2013. (arrive no later than 1:30pm for 2pm appt)    Contact information:   [address]   [place] [address] [phone]       Signed: Ashok Norris, [facility] [phone]  01/27/2013, 1:31 PM

## 2013-01-27 NOTE — Progress Notes (Signed)
1 Day Post-Op  Subjective: Pt very antsy, wants to go out to his car.  Offered nicotine patch and benzo.  He appears more distended.  He has not passed flatus yet.  Tolerated full liquids.  Ambulated in room.    Objective: Vital signs in last 24 hours: Temp:  [97.5 F (36.4 C)-98.4 F (36.9 C)] 98.4 F (36.9 C) (09/25 0612) Pulse Rate:  [66-97] 66 (09/25 0612) Resp:  [13-20] 18 (09/25 0612) BP: (104-171)/(55-122) 104/55 mmHg (09/25 0612) SpO2:  [90 %-99 %] 95 % (09/25 0612) Last BM Date: 01/26/13  Intake/Output from previous day: 09/24 0701 - 09/25 0700 In: 3400 [I.V.:3400] Out: -  Intake/Output this shift:    General appearance: alert, cooperative, appears stated age and no distress GI: +BS abdomen is distended and appropriately tender at incision sites.  Incisions are clean dry and intact.  No evidence of peritonitis.    Lab Results:   Recent Labs  01/26/13 0830 01/27/13 0542  WBC 18.3* 12.5*  HGB 15.6 12.9*  HCT 45.2 36.8*  PLT 308 237   BMET  Recent Labs  01/26/13 0830 01/27/13 0542  NA 135 139  K 3.4* 3.8  CL 98 104  CO2 23 26  GLUCOSE 122* 121*  BUN 9 6  CREATININE 0.84 0.77  CALCIUM 9.1 8.0*   Studies/Results: Ct Abdomen Pelvis W Contrast  01/26/2013   CLINICAL DATA:  Right lower quadrant pain for 2 weeks  EXAM: CT ABDOMEN AND PELVIS WITH CONTRAST  TECHNIQUE: Multidetector CT imaging of the abdomen and pelvis was performed using the standard protocol following bolus administration of intravenous contrast.  CONTRAST:  OMNIPAQUE IOHEXOL 300 MG/ML  SOLN  COMPARISON:  CT pelvis of 07/27/2007  FINDINGS: The lung bases are clear. The liver enhances with no focal abnormality and no ductal dilatation is seen. No calcified gallstones are noted. The pancreas is normal in size in the pancreatic duct is not dilated. The adrenal glands and spleen are unremarkable. The stomach is decompressed. The kidneys enhance with no focal abnormality and on delayed images,  the pelvocaliceal systems are unremarkable. The abdominal aorta is normal in caliber. No adenopathy is seen.  However, within the right lower quadrant extending medially the appendix is markedly dilated and there is an inflammatory reaction surrounding this distended appendix consistent with acute appendicitis. The appendix measures approximately 13 mm in diameter and there is edema at the appendiceal orifice at the base of the cecum. No abscess is seen but perforation cannot be excluded with considerable surrounding inflammatory response. No free peritoneal air is noted. The urinary bladder is unremarkable. The prostate is normal in size. A right inguinal hernia is present containing a nondilated loop of small bowel. There are a few rectosigmoid colonic diverticular noted. The terminal ileum is unremarkable. There is degenerative disc disease at both L4-5 and L5-S1.  IMPRESSION: 1. Findings consistent with acute appendicitis. Possible contained perforation. No abscess. 2. Right inguinal hernia containing a nondilated loop of small bowel. 3. Degenerative disc disease at L4-5 and L5-S1.  CriticalValue/emergent results were called by telephone at the time of interpretation on 01/26/2013 at 12:44 PMto Dr. Rochele Raring, who verbally acknowledged these results.   Electronically Signed   By: Dwyane Dee M.D.   On: 01/26/2013 12:45   Dg Chest Port 1 View  01/26/2013   CLINICAL DATA:  Shortness of Breath  EXAM: PORTABLE CHEST - 1 VIEW  COMPARISON:  June 07, 2003  FINDINGS: There is trace interstitial edema in  the lower lobes. Lungs are otherwise clear. Heart is upper normal in size with normal pulmonary vascularity. No adenopathy. No bone lesions.  IMPRESSION: Trace interstitial edema in the lower lobes. Lungs otherwise clear.   Electronically Signed   By: Bretta Bang   On: 01/26/2013 14:05    Anti-infectives: Anti-infectives   Start     Dose/Rate Route Frequency Ordered Stop   01/26/13 1300  [MAR Hold]   piperacillin-tazobactam (ZOSYN) IVPB 3.375 g     (On MAR Hold since 01/26/13 1415)   3.375 g 12.5 mL/hr over 240 Minutes Intravenous  Once 01/26/13 1245 01/26/13 1719      Assessment/Plan: Acute Appendicitis without perforation S/p Lap Appy(Dr. Andrey Campanile)  POD #1  He has not passed flatus and abdomen is distended.  I encouraged him to start ambulating in hallways. His pain is much improved and is tolerated oral pain medication.  He is voiding.   -Await bowel function -advance diet -IS -home later this afternoon   LOS: 1 day    Bonner Puna [facility] ANP-BC Pager [phone]  01/27/2013 8:39 AM

## 2013-01-27 NOTE — Discharge Summary (Signed)
Pt seen and examined Discussed d/c instructions  Sharice Harriss M. Lerry Cordrey, MD, FACS General, Bariatric, & Minimally In[facility]  

## 2013-02-22 ENCOUNTER — Encounter (INDEPENDENT_AMBULATORY_CARE_PROVIDER_SITE_OTHER): Payer: Self-pay | Admitting: General Surgery

## 2015-06-13 ENCOUNTER — Encounter: Payer: Self-pay | Admitting: Gastroenterology

## 2015-08-01 ENCOUNTER — Ambulatory Visit: Payer: Self-pay | Admitting: Gastroenterology

## 2015-08-21 ENCOUNTER — Ambulatory Visit: Payer: Self-pay | Admitting: Gastroenterology

## 2018-10-05 ENCOUNTER — Emergency Department (HOSPITAL_COMMUNITY)
Admission: EM | Admit: 2018-10-05 | Discharge: 2018-10-06 | Disposition: A | Discharge status: Home / Self Care | Payer: Self-pay | Attending: Emergency Medicine | Admitting: Emergency Medicine

## 2018-10-05 DIAGNOSIS — W109XXD Fall (on) (from) unspecified stairs and steps, subsequent encounter: Secondary | ICD-10-CM | POA: Insufficient documentation

## 2018-10-05 DIAGNOSIS — M5442 Lumbago with sciatica, left side: Secondary | ICD-10-CM | POA: Insufficient documentation

## 2018-10-05 DIAGNOSIS — F17210 Nicotine dependence, cigarettes, uncomplicated: Secondary | ICD-10-CM | POA: Insufficient documentation

## 2018-10-05 NOTE — ED Notes (Signed)
Bed: SE83 Expected date:  Expected time:  Means of arrival:  Comments: EMS 53 yo male back pain/fell 1 month ago-worse 3 days ago

## 2018-10-06 ENCOUNTER — Encounter (HOSPITAL_COMMUNITY): Payer: Self-pay | Admitting: Emergency Medicine

## 2018-10-06 ENCOUNTER — Emergency Department (HOSPITAL_COMMUNITY): Payer: Self-pay

## 2018-10-06 MED ORDER — PREDNISONE 20 MG PO TABS: ORAL_TABLET | ORAL | 0 refills | Status: DC

## 2018-10-06 MED ORDER — OXYCODONE HCL 5 MG PO TABS: 5.0000 mg | ORAL_TABLET | Freq: Four times a day (QID) | ORAL | 0 refills | Status: DC | PRN

## 2018-10-06 MED ORDER — METHOCARBAMOL 1000 MG/10ML IJ SOLN
1000.0000 mg | Freq: Once | INTRAMUSCULAR | Status: AC
  Administered 2018-10-06: 03:00:00 1000 mg via INTRAMUSCULAR
  Filled 2018-10-06: qty 10

## 2018-10-06 MED ORDER — HYDROMORPHONE HCL 1 MG/ML IJ SOLN
1.0000 mg | Freq: Once | INTRAMUSCULAR | Status: AC
  Administered 2018-10-06: 1 mg via INTRAMUSCULAR
  Filled 2018-10-06: qty 1

## 2018-10-06 MED ORDER — METHOCARBAMOL 500 MG PO TABS: 500.0000 mg | ORAL_TABLET | Freq: Two times a day (BID) | ORAL | 0 refills | Status: DC

## 2018-10-06 MED ORDER — OXYCODONE HCL 5 MG PO TABS
10.0000 mg | ORAL_TABLET | Freq: Once | ORAL | Status: AC
  Administered 2018-10-06: 10 mg via ORAL
  Filled 2018-10-06: qty 2

## 2018-10-06 NOTE — Discharge Instructions (Addendum)
1. Medications: Roxicodone, prednisone, vicodin, usual home medications 2. Treatment: rest, drink plenty of fluids, gentle stretching as discussed, alternate ice and heat 3. Follow Up: Please followup with your primary doctor in 3 days for discussion of your diagnoses and further evaluation after today's visit; if you do not have a primary care doctor use the resource guide provided to find one;  Return to the ER for worsening back pain, difficulty walking, loss of bowel or bladder control or other concerning symptoms

## 2018-10-06 NOTE — ED Notes (Signed)
Walked pt with provider

## 2018-10-06 NOTE — ED Provider Notes (Signed)
[facility] DEPT Provider Note   CSN: [id number] Arrival date & time: 10/05/18  2353    History   Chief Complaint Chief Complaint  Patient presents with  . Back Pain    mid to lower back from fall 1 month ago never went to hospital    HPI Glen Friedman is a 53 y.o. male with a hx of TB presents to the Emergency Department complaining of gradual, persistent, progressively worsening back pain onset approximately 1 month ago after a fall.  Patient reports he slipped on a set of stairs and struck the left side of his back along with his left shoulder.  He reports no back pain initially but worsening pain over the neck several weeks.  He reports he is been treating it at home with Xanax, Valium, Tylenol and ibuprofen.  He reports in the last 3 days he has had increasing pain now with radiation down the left leg and into the left foot.  He also reports some paresthesias of the left leg and some subjectively decreased sensation.  He reports he is able to walk but is slow due to significant pain.  No other treatments prior to arrival.  Patient has not sought medical care for this previously.  He reports he does not receive regular medical care.  He reports one episode of bowel incontinence yesterday however he feels it was due to inability to move quickly to go to the bathroom as he had the urge to defecate.  He has had no urinary incontinence.  Patient denies saddle anesthesia.  Patient also denies history of cancer, IV drug use or anticoagulants.  He does report low back surgery several years ago by Dr. Saintclair Halsted and has not had any back problems since that time.      The history is provided by the patient and medical records. No language interpreter was used.    Past Medical History:  Diagnosis Date  . Depression   . Inguinal hernia    "think so, on my right" (01/26/2013)  . Sleep apnea   . Tuberculosis ~ 1998    Patient Active Problem List   Diagnosis Date Noted   . Acute appendicitis without mention of peritonitis 01/26/2013  . S/P laparoscopic appendectomy 01/26/2013    Past Surgical History:  Procedure Laterality Date  . ABSCESS DRAINAGE  07/27/2007   scrotal/; MRSA positive/microbiology notes 07/27/2007 (01/26/2013)  . APPENDECTOMY  01/26/2013  . BACK SURGERY    . LAPAROSCOPIC APPENDECTOMY N/A 01/26/2013   Procedure: APPENDECTOMY LAPAROSCOPIC;  Surgeon: Gayland Curry, MD;  Location: [facility];  Service: General;  Laterality: N/A;  . LUMBAR [place]     "clipped herniated disc @ L4-5" (01/26/2013)        Home Medications    Prior to Admission medications   Medication Sig Start Date End Date Taking? Authorizing Provider  methocarbamol (ROBAXIN) 500 MG tablet Take 1 tablet (500 mg total) by mouth 2 (two) times daily. 10/06/18   Asja Frommer, Jarrett Soho, PA-C  oxyCODONE (ROXICODONE) 5 MG immediate release tablet Take 1 tablet (5 mg total) by mouth every 6 (six) hours as needed for severe pain. 10/06/18   Na Waldrip, Jarrett Soho, PA-C  oxyCODONE-acetaminophen (PERCOCET/ROXICET) 5-325 MG per tablet Take 1-2 tablets by mouth every 4 (four) hours as needed. 01/27/13   Riebock, Estill Bakes, NP  predniSONE (DELTASONE) 20 MG tablet 3 tabs po daily x 3 days, then 2 tabs x 3 days, then 1.5 tabs x 3 days, then 1 tab x  3 days, then 0.5 tabs x 3 days 10/06/18   Annalysia Willenbring, Jarrett Soho, PA-C    Family History Family History  Problem Relation Age of Onset  . Breast cancer Mother   . Colon cancer Father     Social History Social History   Tobacco Use  . Smoking status: Current Every Day Smoker    Packs/day: 2.00    Years: 28.00    Pack years: 56.00    Types: Cigarettes  . Smokeless tobacco: Former Network engineer Use Topics  . Alcohol use: Yes    Comment: 01/26/2013 "couple times/month I'll have a few beers"  . Drug use: Yes    Types: Marijuana    Comment: 01/26/2013 "smoke as much as I can; last use 01/25/2013"     Allergies   Advil [ibuprofen] and Aleve [naproxen  sodium]   Review of Systems Review of Systems  Constitutional: Negative for fatigue and fever.  Respiratory: Negative for chest tightness and shortness of breath.   Cardiovascular: Negative for chest pain.  Gastrointestinal: Negative for abdominal pain, diarrhea, nausea and vomiting.  Genitourinary: Negative for dysuria, frequency, hematuria and urgency.  Musculoskeletal: Positive for back pain and gait problem ( 2/2 pain). Negative for joint swelling, neck pain and neck stiffness.  Skin: Negative for rash.  Neurological: Negative for weakness, light-headedness, numbness and headaches.  All other systems reviewed and are negative.    Physical Exam Updated Vital Signs BP 123/81   Pulse 100   Temp 98.1 F (36.7 C) (Oral)   Resp 20   SpO2 98%   Physical Exam Vitals signs and nursing note reviewed.  Constitutional:      General: He is not in acute distress.    Appearance: He is well-developed. He is not diaphoretic.  HENT:     Head: Normocephalic and atraumatic.     Mouth/Throat:     Pharynx: No oropharyngeal exudate.  Eyes:     Conjunctiva/sclera: Conjunctivae normal.  Neck:     Musculoskeletal: Normal range of motion and neck supple.     Comments: Full ROM without pain Cardiovascular:     Rate and Rhythm: Normal rate and regular rhythm.  Pulmonary:     Effort: Pulmonary effort is normal. No respiratory distress.     Breath sounds: Normal breath sounds. No wheezing.  Abdominal:     General: There is no distension.     Palpations: Abdomen is soft.     Tenderness: There is no abdominal tenderness.  Musculoskeletal:     Comments: Midline tenderness to the L-spine.  No midline tenderness to the T-spine Tenderness to palpation along the left paraspinal muscles of the L-spine.  Lymphadenopathy:     Cervical: No cervical adenopathy.  Skin:    General: Skin is warm and dry.     Findings: No erythema or rash.  Neurological:     Mental Status: He is alert.     Comments:  Speech is clear and goal oriented, follows commands Normal 5/5 strength in upper and lower extremities bilaterally including dorsiflexion and plantar flexion, strong and equal grip strength Sensation normal to light and sharp touch the right leg, subjectively decreased in the left leg, but able to differentiate sharp and dull. Moves extremities without ataxia, coordination intact Gait testing deferred due to pain. No Clonus  Psychiatric:        Behavior: Behavior normal.      ED Treatments / Results   Radiology Dg Thoracic Spine 2 View  Result Date: 10/06/2018 CLINICAL  DATA:  53 year old male with fall and back pain. EXAM: THORACIC SPINE 2 VIEWS; LUMBAR SPINE - COMPLETE 4+ VIEW COMPARISON:  Chest radiograph dated 01/26/2013 and CT of the abdomen pelvis dated 01/26/2013 FINDINGS: Evaluation is limited due to superimposition of the soft tissues and body habitus. No definite acute fracture or subluxation. There are degenerative changes primarily in the lower lumbar spine. The visualized posterior elements appear intact. The soft tissues are grossly unremarkable. IMPRESSION: No definite acute/traumatic thoracic or lumbar spine pathology. Electronically Signed   By: Anner Crete M.D.   On: 10/06/2018 02:15   Dg Lumbar Spine Complete  Result Date: 10/06/2018 CLINICAL DATA:  53 year old male with fall and back pain. EXAM: THORACIC SPINE 2 VIEWS; LUMBAR SPINE - COMPLETE 4+ VIEW COMPARISON:  Chest radiograph dated 01/26/2013 and CT of the abdomen pelvis dated 01/26/2013 FINDINGS: Evaluation is limited due to superimposition of the soft tissues and body habitus. No definite acute fracture or subluxation. There are degenerative changes primarily in the lower lumbar spine. The visualized posterior elements appear intact. The soft tissues are grossly unremarkable. IMPRESSION: No definite acute/traumatic thoracic or lumbar spine pathology. Electronically Signed   By: Anner Crete M.D.   On: 10/06/2018  02:15    Procedures Procedures (including critical care time)  Medications Ordered in ED Medications  oxyCODONE (Oxy IR/ROXICODONE) immediate release tablet 10 mg (has no administration in time range)  HYDROmorphone (DILAUDID) injection 1 mg (1 mg Intramuscular Given 10/06/18 0256)  methocarbamol (ROBAXIN) injection 1,000 mg (1,000 mg Intramuscular Given 10/06/18 0256)     Initial Impression / Assessment and Plan / ED Course  I have reviewed the triage vital signs and the nursing notes.  Pertinent labs & imaging results that were available during my care of the patient were reviewed by me and considered in my medical decision making (see chart for details).  Clinical Course as of Oct 06 438  Wed Oct 06, 2018  0415 Pt reports his pain is not improved, but he is able to sit, stand and walk without difficulty.  No limp, foot drop or ataxic gait.  Pt with normal balance and normal gait.    [HM]  [phone] PDMP reviewed - there are no controlled substance prescriptions in the database.  I am concerned about where patient is receiving his xanax and valium.    [HM]    Clinical Course User Index [HM] Daneya Hartgrove, Jarrett Soho, [place]      Patient with severe lower back pain with radiculopathy.  He has some subjective sensory loss of the left leg.  Plain films without acute abnormality including no fracture.  Will give pain control and ambulate.  4:35 AM Patient given Dilaudid and Robaxin here in the emergency department.  Pt is able to ambulate without difficulty in the room.  He moves without difficulty has no ataxia, no wide-based gait.  Continues to have sensation in both legs.  At this time I have not found any red flags for back pain.  Will give oral pain control here before discharge.  Pt is very angry that I will not order an MRI and arrange surgery for patient tonight.  I have explained to him that there is not a neurologic emergency that necessitates this and he will need to follow-up  outpatient.  He is extremely belligerent about this stating he could have just called someone outpatient if that is what he wanted to do.  I have offered him orthopedic and neurosurgical outpatient resources.  Will give control, muscle  relaxer and steroids outpatient as he symptoms are most consistent with radiculopathy.  Final Clinical Impressions(s) / ED Diagnoses   Final diagnoses:  Acute left-sided low back pain with left-sided sciatica    ED Discharge Orders         Ordered    methocarbamol (ROBAXIN) 500 MG tablet  2 times daily     10/06/18 0439    predniSONE (DELTASONE) 20 MG tablet     10/06/18 0439    oxyCODONE (ROXICODONE) 5 MG immediate release tablet  Every 6 hours PRN     10/06/18 0439           Vaishnavi Dalby, Jarrett Soho, PA-C 10/06/18 0441    Molpus, Jenny Reichmann, MD 10/06/18 0630

## 2018-10-06 NOTE — ED Triage Notes (Signed)
Pt  Comes from home had a fall a month ago and is having increased pain in  Lower mid back since that day. V/s on 150/96, hr 88 rr16, spo2 98 room air. Temp 98.1.  Pain score 6 out 10. Hurts on movement.

## 2018-10-11 ENCOUNTER — Other Ambulatory Visit: Payer: Self-pay | Admitting: Student

## 2018-10-11 DIAGNOSIS — M5416 Radiculopathy, lumbar region: Secondary | ICD-10-CM

## 2018-10-26 ENCOUNTER — Other Ambulatory Visit (HOSPITAL_COMMUNITY): Payer: Self-pay | Admitting: Student

## 2018-10-26 DIAGNOSIS — M5416 Radiculopathy, lumbar region: Secondary | ICD-10-CM

## 2018-10-28 ENCOUNTER — Other Ambulatory Visit: Payer: Self-pay

## 2018-11-02 ENCOUNTER — Other Ambulatory Visit (HOSPITAL_COMMUNITY): Payer: Self-pay | Admitting: Student

## 2018-11-22 ENCOUNTER — Other Ambulatory Visit (HOSPITAL_COMMUNITY)
Admission: RE | Admit: 2018-11-22 | Discharge: 2018-11-22 | Disposition: A | Discharge status: Home / Self Care | Payer: HRSA Program | Source: Ambulatory Visit | Attending: Student | Admitting: Student

## 2018-11-22 DIAGNOSIS — Z1159 Encounter for screening for other viral diseases: Secondary | ICD-10-CM | POA: Insufficient documentation

## 2018-11-22 LAB — SARS CORONAVIRUS 2 (TAT 6-24 HRS): SARS Coronavirus 2: NEGATIVE

## 2018-11-25 ENCOUNTER — Other Ambulatory Visit: Payer: Self-pay

## 2018-11-25 ENCOUNTER — Ambulatory Visit (HOSPITAL_COMMUNITY)
Admission: RE | Admit: 2018-11-25 | Discharge: 2018-11-25 | Disposition: A | Discharge status: Home / Self Care | Payer: Self-pay | Source: Ambulatory Visit | Attending: Student | Admitting: Student

## 2018-11-25 DIAGNOSIS — M5416 Radiculopathy, lumbar region: Secondary | ICD-10-CM | POA: Insufficient documentation

## 2018-11-25 MED ORDER — GADOBUTROL 1 MMOL/ML IV SOLN
10.0000 mL | Freq: Once | INTRAVENOUS | Status: AC | PRN
  Administered 2018-11-25: 10 mL via INTRAVENOUS

## 2019-09-28 ENCOUNTER — Ambulatory Visit (INDEPENDENT_AMBULATORY_CARE_PROVIDER_SITE_OTHER): Payer: Self-pay | Admitting: Dermatology

## 2019-09-28 ENCOUNTER — Encounter: Payer: Self-pay | Admitting: Dermatology

## 2019-09-28 ENCOUNTER — Other Ambulatory Visit: Payer: Self-pay

## 2019-09-28 DIAGNOSIS — L309 Dermatitis, unspecified: Secondary | ICD-10-CM

## 2019-09-28 DIAGNOSIS — D485 Neoplasm of uncertain behavior of skin: Secondary | ICD-10-CM

## 2019-09-28 NOTE — Patient Instructions (Addendum)
Biopsy, Surgery (Curettage) & Surgery (Excision) Aftercare Instructions  1. Okay to remove bandage in 24 hours  2. Wash area with soap and water  3. Apply Vaseline to area twice daily until healed (Not Neosporin)  4. Okay to cover with a Band-Aid to decrease the chance of infection or prevent irritation from clothing; also it's okay to uncover lesion at home.  5. Suture instructions: return to our office in 7-10 or 10-14 days for a nurse visit for suture removal. Variable healing with sutures, if pain or itching occurs call our office. It's okay to shower or bathe 24 hours after sutures are given.  6. The following risks may occur after a biopsy, curettage or excision: bleeding, scarring, discoloration, recurrence, infection (redness, yellow drainage, pain or swelling).  7. For questions, concerns and results call our office at [place] before 4pm & Friday before 3pm. Biopsy results will be available in 1 week.  First follow-up in 2 years for [facility].  He continues to take episodic prednisone from walk-in clinic and family doctor for his very symptomatic rash.  I went into great detail about the long-term risks of prednisone including cataracts,, glaucoma, diabetes, hypertension, weight gain, mental health disorders, bone thinning, etc.  I do understand how miserable itchy rashes can be, but if he desires repeated courses of prednisone I told [name] quite frankly that I would be the wrong doctor for him.  Today the rash looks more like a steroid-induced acne than a primary eczema, but 2 biopsies obtained from the left mid back.  If Glen Friedman would kindly call my office on Friday then 1 of [place] will discussed the meaning of the biopsy.  The risk of the biopsy would be a small scar or that it may not lead to a cure or be of much help.  If appropriate, after the biopsy we will consider obtaining a G6PD to see if we can safely try dapsone.  Alternatively if it looks more like eczema he may  be a candidate for Dupixent or if it looks more like acne we would consider minocycline or isotretinoin.  For now, since there is not much improvement with the topical cream we will hold on that.

## 2019-10-01 ENCOUNTER — Encounter: Payer: Self-pay | Admitting: Dermatology

## 2019-10-01 NOTE — Progress Notes (Addendum)
   Follow-Up Visit   Subjective  Glen Friedman is a 54 y.o. male who presents for the following: Rash (Patient here today for rash x 2 years on back and abdomen.  Previous treatment Dr. Denna Haggard gave was 0.1% Triamcinolone cream and it does help, patient also went to another [facility] and was given a steroid and it did help but once the steroid was completeed the rash came back. No household change).  Rash Location: Torso Duration: Years Quality: Occurring Associated Signs/Symptoms: Itch Modifying Factors: Repeated boluses of prednisone which transiently help and then it flares Severity:  Timing: Context:   The following portions of the chart were reviewed this encounter and updated as appropriate: Tobacco  Allergies  Meds  Problems  Med Hx  Surg Hx  Fam Hx      Objective  Well appearing patient in no apparent distress; mood and affect are within normal limits.  All skin waist up examined. Plus lymph nodes, mucosa.   Assessment & Plan  Neoplasm of uncertain behavior of skin (2) Left Upper Back  Skin / nail biopsy Type of biopsy: tangential   Informed consent: discussed and consent obtained   Timeout: patient name, date of birth, surgical site, and procedure verified   Procedure prep:  Patient was prepped and draped in usual sterile fashion Prep type:  Chlorhexidine Anesthesia: the lesion was anesthetized in a standard fashion   Anesthetic:  1% lidocaine w/ epinephrine 1-100,000 local infiltration Instrument used: flexible razor blade   Hemostasis achieved with: ferric subsulfate   Outcome: patient tolerated procedure well   Post-procedure details: wound care instructions given    Specimen 1 - Surgical pathology Differential Diagnosis: eczema vs acne from prednisone Check Margins: No  Left Lower Back  Skin / nail biopsy Type of biopsy: tangential   Informed consent: discussed and consent obtained   Timeout: patient name, date of birth, surgical site, and  procedure verified   Procedure prep:  Patient was prepped and draped in usual sterile fashion Prep type:  Chlorhexidine Anesthesia: the lesion was anesthetized in a standard fashion   Anesthetic:  1% lidocaine w/ epinephrine 1-100,000 local infiltration Instrument used: flexible razor blade   Hemostasis achieved with: ferric subsulfate   Outcome: patient tolerated procedure well   Post-procedure details: wound care instructions given    Specimen 2 - Surgical pathology Differential Diagnosis:  Check Margins: No

## 2019-10-04 ENCOUNTER — Telehealth: Payer: Self-pay

## 2019-10-04 DIAGNOSIS — L739 Follicular disorder, unspecified: Secondary | ICD-10-CM

## 2019-10-04 MED ORDER — MINOCYCLINE HCL 50 MG PO CAPS: 50.0000 mg | ORAL_CAPSULE | Freq: Two times a day (BID) | ORAL | 1 refills | Status: DC

## 2019-10-04 NOTE — Telephone Encounter (Signed)
Phone call from patient wanting his pathology results.  Results given to patient and Dr. Onalee Hua recommendations.

## 2019-10-04 NOTE — Telephone Encounter (Signed)
-----   Message from Lavonna Monarch, MD sent at 10/01/2019  8:14 AM EDT ----- Lease inform patient that biopsy shows folliculitis rather than eczema.  This may start from within the skin but definitely can be exacerbated by repeated prednisone.  Dr. Darene Lamer would like him to try a 40-month course of generic oral minocycline, 50 mg twice daily.  Small risk of stomach upset or rash.  You may take the pills with or without food.  Follow-up by phone in 2 months.

## 2019-10-12 ENCOUNTER — Telehealth: Payer: Self-pay | Admitting: Dermatology

## 2019-10-12 NOTE — Telephone Encounter (Signed)
Mom called for results and wants to know if "she can catch it"

## 2019-10-30 NOTE — Addendum Note (Signed)
Addended by: Lavonna Monarch on: 10/30/2019 10:07 AM   Modules accepted: Level of Service

## 2019-11-01 ENCOUNTER — Telehealth: Payer: Self-pay | Admitting: Dermatology

## 2019-11-01 DIAGNOSIS — L739 Follicular disorder, unspecified: Secondary | ICD-10-CM

## 2019-11-01 MED ORDER — MINOCYCLINE HCL 50 MG PO CAPS: 50.0000 mg | ORAL_CAPSULE | Freq: Two times a day (BID) | ORAL | 0 refills | Status: AC

## 2019-11-01 NOTE — Telephone Encounter (Addendum)
Patient is calling saying that rash has come back worse than it ever has.  Patient has finished Amoxycillin.  What does he need to do?  Also would like diagnosis from pathology report because he would like to do research on it himself.  [facility] [place] for [facility].  (Chart # [id number])  Patient gives permission for  [facility] to speak with mother, Juliann Pulse.

## 2019-11-01 NOTE — Telephone Encounter (Signed)
Phone call to patient and spoke with his mother regarding the patient's diagnosis and what Dr. Onalee Hua recommendations are for the patient's condition.  Patient's mother given patient's diagnosis and Dr. Onalee Hua recommendation to continue the current treatment until next month and then give the office a call back.  Patient's mother aware.  Refill sent into patient's Pharmacy.

## 2019-11-14 ENCOUNTER — Telehealth: Payer: Self-pay | Admitting: Dermatology

## 2019-11-14 NOTE — Telephone Encounter (Signed)
Reaction to amoxicillin: bones hurting, feet hurt, acid in throat., feet swelling,shortness of breath Pharmacy: [facility] [place] If he's not there can talk to his mom, Nthony Lefferts

## 2019-11-14 NOTE — Telephone Encounter (Signed)
Phone call to patient to inform him that he will need to stop taking the Minocycline and go to the nearest Emergency Room.  I spoke with patient's mother Glen Friedman and she states that she will give the patient my message about stopping the medication and going to the nearest Emergency Room for treatment.  Patient's mother Glen Friedman wanted to know if Dr. Denna Haggard would send in something different.  I got a verbal from Dr. Denna Haggard stating that he will not send in another prescription at this time, patient should seek medical attention at the nearest Emergency Room and give [place] a call back next week with an update.  Patient's mother aware.

## 2019-12-23 ENCOUNTER — Other Ambulatory Visit: Payer: Self-pay | Admitting: Dermatology

## 2019-12-23 DIAGNOSIS — L739 Follicular disorder, unspecified: Secondary | ICD-10-CM

## 2020-11-28 IMAGING — MR MRI LUMBAR SPINE WITHOUT AND WITH CONTRAST
4 of 7 series · 23 of 48 positions shown · IV contrast (agent unspecified)
Comparison: Lumbar radiographs 10/06/2018

CLINICAL DATA: Lumbar radiculopathy.  Fall.  Left leg pain

EXAM:
MRI LUMBAR SPINE WITHOUT AND WITH CONTRAST
TECHNIQUE: Multiplanar and multiecho pulse sequences of the lumbar spine were
obtained without and with intravenous contrast.
CONTRAST:  10 mL Gadovist IV

[Series 5: T2 · sagittal · 4.0mm · 0.76mm/px · 5 of 14 slices shown (1 of 2)]
[im 1/14]
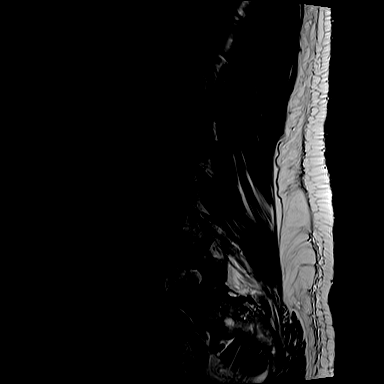
[im 4/14]
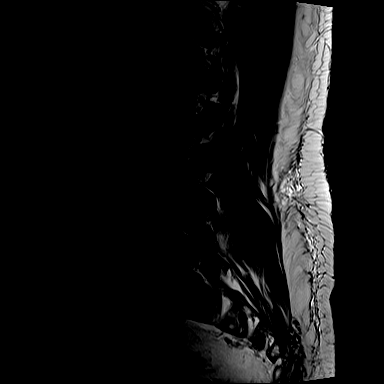
[im 7/14]
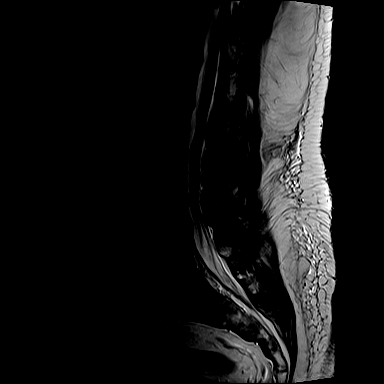
[im 10/14]
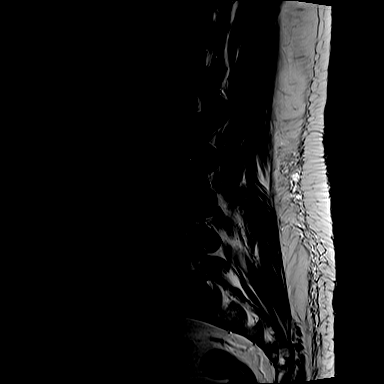
[im 14/14]
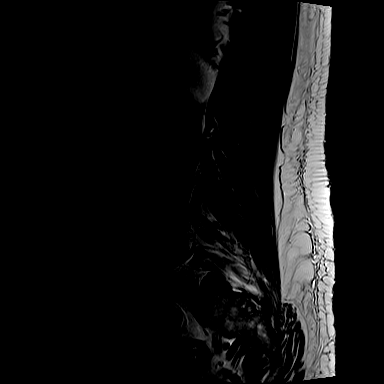

[Series 7: T1 · sagittal · 4.0mm · 0.88mm/px · 4 of 14 slices shown (1 of 2)]
[im 1/14]
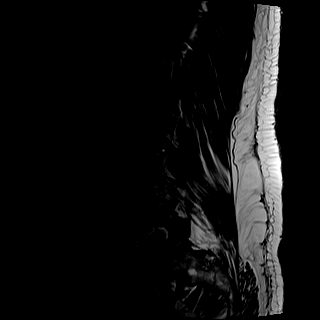
[im 5/14]
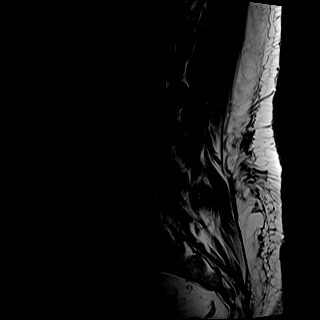
[im 9/14]
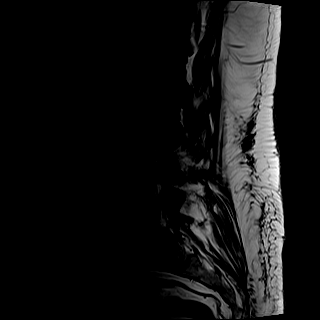
[im 14/14]
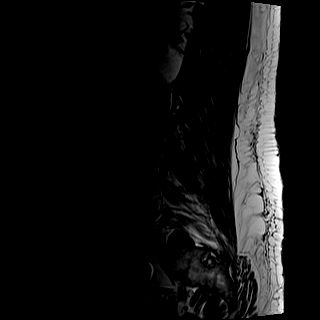

[Series 8: T2 · axial · 4.0mm · 0.57mm/px · z∈[-113,+65]mm · 8 of 32 slices shown (2 of 2)]
[im 1/32]
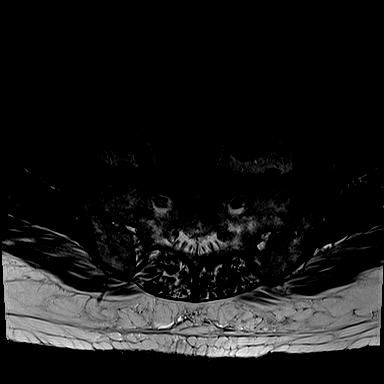
[im 4/32]
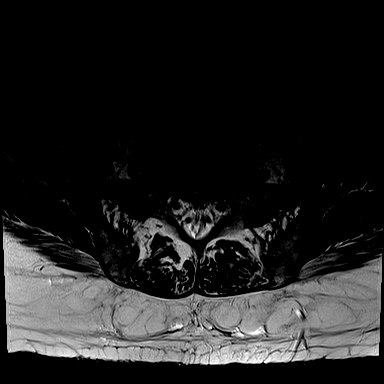
[im 11/32]
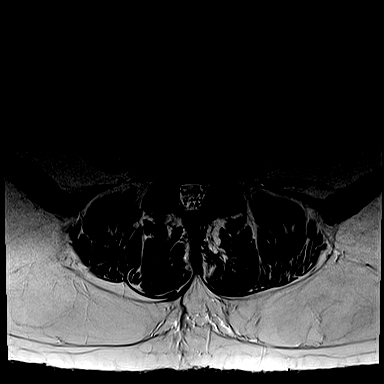
[im 14/32]
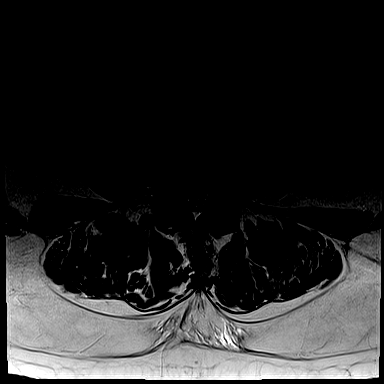
[im 18/32]
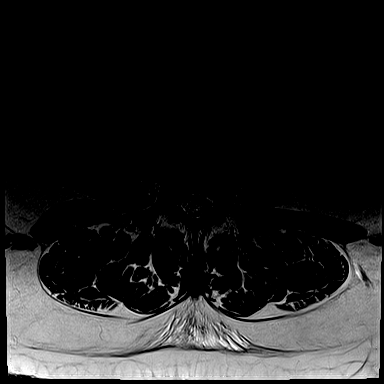
[im 21/32]
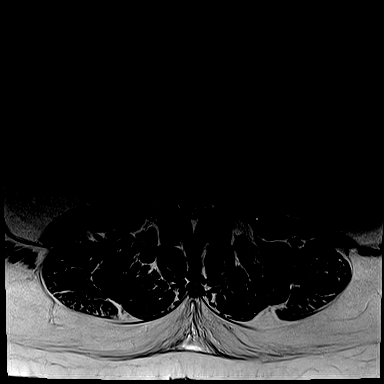
[im 28/32]
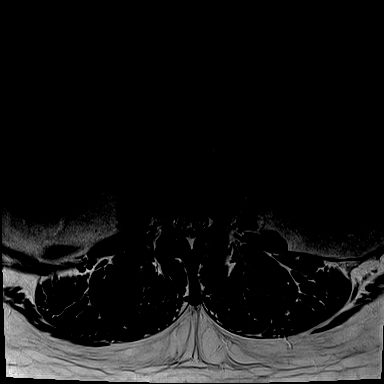
[im 32/32]
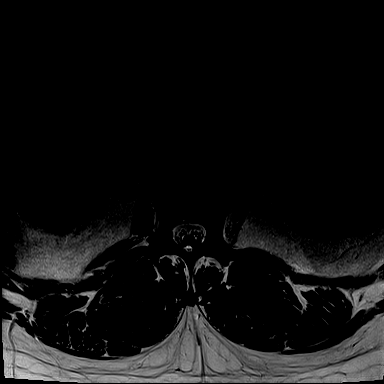

[Series 9: T1 · axial · 4.0mm · 0.34mm/px · z∈[-113,+45]mm · 6 of 32 slices shown (2 of 2)]
[im 1/32]
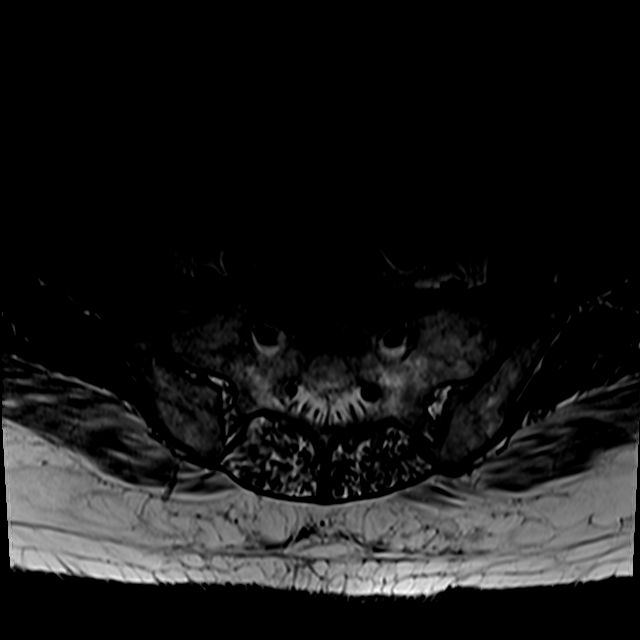
[im 4/32]
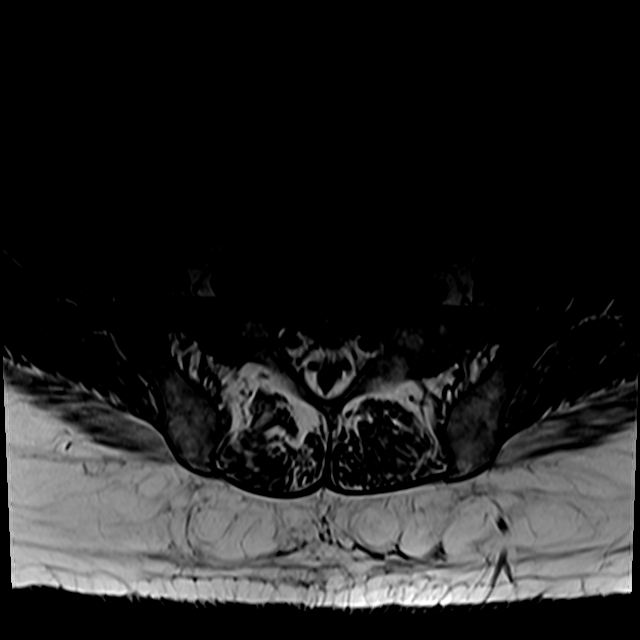
[im 11/32]
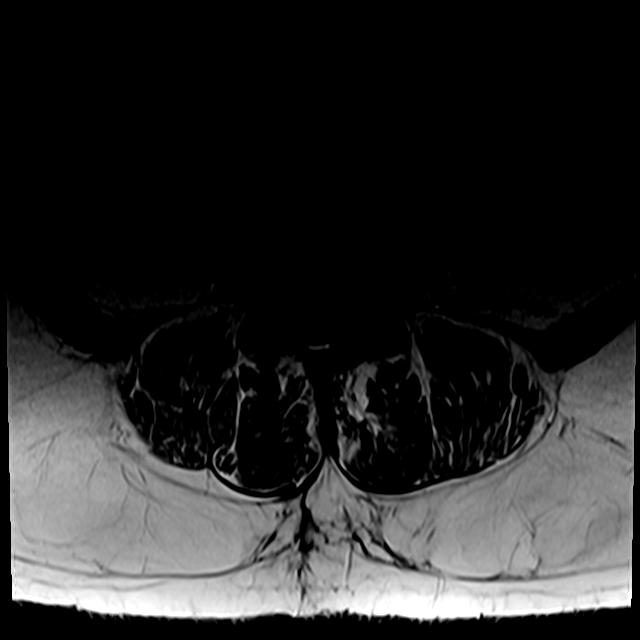
[im 14/32]
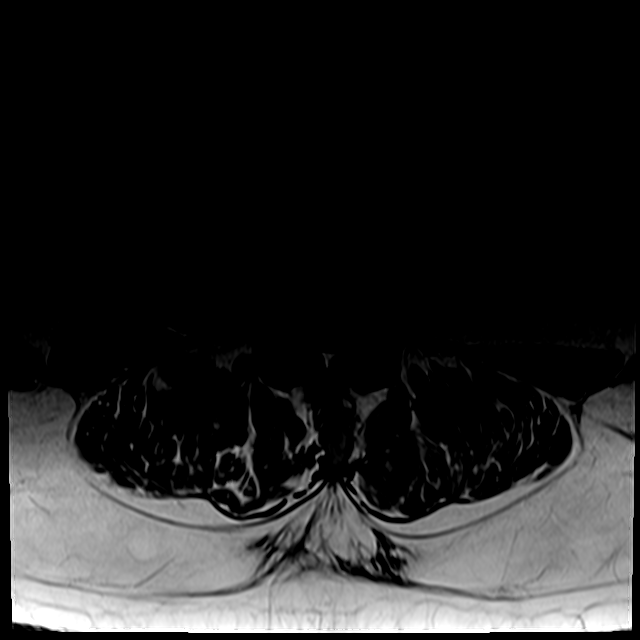
[im 18/32]
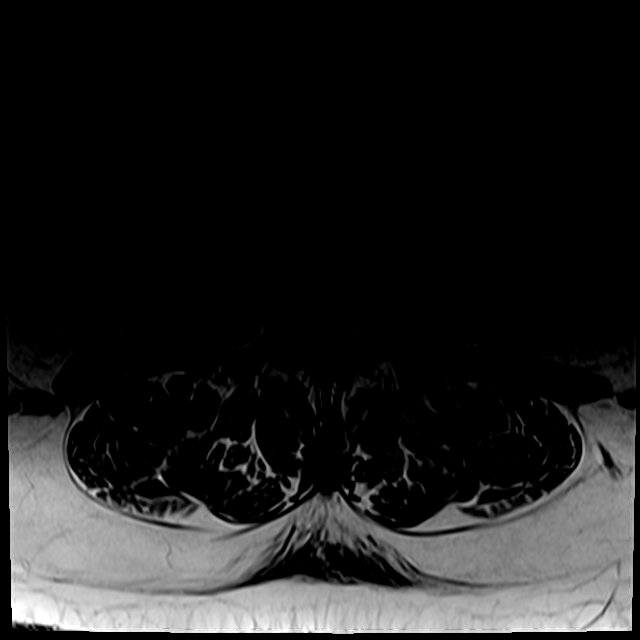
[im 28/32]
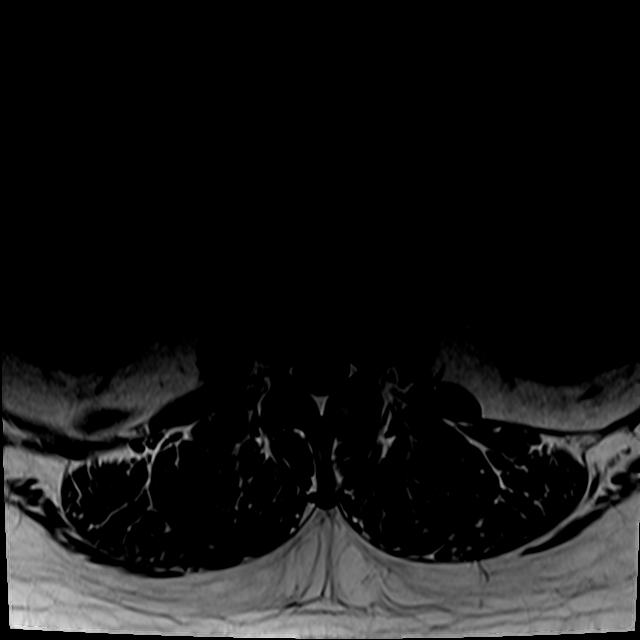

[23 of 48 positions shown; findings below may reference images not displayed]

FINDINGS: Segmentation:  Normal

Alignment:  Normal

Vertebrae:  Normal bone marrow.  Negative for fracture or mass.

Conus medullaris and cauda equina: Conus extends to the L1-2 level.
Conus and cauda equina appear normal.

Paraspinal and other soft tissues: Negative for paraspinous mass or
adenopathy. Normal enhancement of the surrounding soft tissues.

Disc levels:

L1-2: Mild disc and mild facet degeneration.  Negative for stenosis

L2-3: Moderately large extraforaminal disc protrusion on the left
with displacement of the left L2 nerve root. Mild spinal stenosis
with disc bulging and mild facet degeneration.

L3-4: Disc degeneration with broad-based central disc protrusion and
associated endplate spurring. Bilateral facet hypertrophy. Moderate
spinal stenosis. Moderate subarticular stenosis bilaterally.

L4-5: Advanced disc degeneration with disc space narrowing and
spurring. Right laminectomy. No recurrent disc protrusion. Moderate
subarticular stenosis bilaterally due to spurring

L5-S1: Disc degeneration and spurring. Mild facet degeneration. Mild
subarticular stenosis left greater than right.
IMPRESSION: 1. Moderately large extraforaminal disc protrusion on the left at
L2-3 with displacement of the left L2 nerve root. Mild spinal
stenosis
2. Moderate spinal stenosis and moderate subarticular stenosis
bilaterally L3-4
3. Postop changes right at L4-5. Moderate subarticular stenosis
bilaterally due to spurring.

## 2023-11-17 ENCOUNTER — Other Ambulatory Visit (HOSPITAL_COMMUNITY): Payer: Self-pay
